# Patient Record
Sex: Female | Born: 1970
Health system: Southern US, Community
[De-identification: ages and names within clinical notes are randomized; demographics above are authoritative.]

## PROBLEM LIST (undated history)

## (undated) DIAGNOSIS — Q211 Atrial septal defect: Secondary | ICD-10-CM

## (undated) DIAGNOSIS — Q2112 Patent foramen ovale: Secondary | ICD-10-CM

## (undated) DIAGNOSIS — Q210 Ventricular septal defect: Secondary | ICD-10-CM

## (undated) HISTORY — DX: Patent foramen ovale: Q21.12

## (undated) HISTORY — DX: Atrial septal defect: Q21.1

## (undated) HISTORY — DX: Ventricular septal defect: Q21.0

---

## 1998-08-27 ENCOUNTER — Other Ambulatory Visit: Admission: RE | Admit: 1998-08-27 | Discharge: 1998-08-27 | Payer: Self-pay | Admitting: Family Medicine

## 1999-05-10 ENCOUNTER — Other Ambulatory Visit: Admission: RE | Admit: 1999-05-10 | Discharge: 1999-05-10 | Payer: Self-pay | Admitting: Obstetrics & Gynecology

## 2000-04-10 ENCOUNTER — Other Ambulatory Visit: Admission: RE | Admit: 2000-04-10 | Discharge: 2000-04-10 | Payer: Self-pay | Admitting: *Deleted

## 2001-04-29 ENCOUNTER — Other Ambulatory Visit: Admission: RE | Admit: 2001-04-29 | Discharge: 2001-04-29 | Payer: Self-pay | Admitting: Obstetrics and Gynecology

## 2001-05-29 ENCOUNTER — Encounter: Payer: Self-pay | Admitting: Obstetrics and Gynecology

## 2001-05-29 ENCOUNTER — Encounter: Admission: RE | Admit: 2001-05-29 | Discharge: 2001-05-29 | Payer: Self-pay | Admitting: Obstetrics and Gynecology

## 2001-06-25 ENCOUNTER — Encounter: Admission: RE | Admit: 2001-06-25 | Discharge: 2001-06-25 | Payer: Self-pay | Admitting: Obstetrics and Gynecology

## 2001-06-25 ENCOUNTER — Encounter: Payer: Self-pay | Admitting: Obstetrics and Gynecology

## 2002-05-12 ENCOUNTER — Other Ambulatory Visit: Admission: RE | Admit: 2002-05-12 | Discharge: 2002-05-12 | Payer: Self-pay | Admitting: Obstetrics and Gynecology

## 2002-12-16 ENCOUNTER — Ambulatory Visit (HOSPITAL_COMMUNITY): Admission: RE | Admit: 2002-12-16 | Discharge: 2002-12-16 | Payer: Self-pay | Admitting: Obstetrics and Gynecology

## 2003-02-02 ENCOUNTER — Encounter: Admission: RE | Admit: 2003-02-02 | Discharge: 2003-02-02 | Payer: Self-pay | Admitting: Obstetrics and Gynecology

## 2003-04-14 ENCOUNTER — Inpatient Hospital Stay (HOSPITAL_COMMUNITY): Admission: AD | Admit: 2003-04-14 | Discharge: 2003-04-17 | Payer: Self-pay | Admitting: Obstetrics and Gynecology

## 2003-04-14 ENCOUNTER — Encounter (INDEPENDENT_AMBULATORY_CARE_PROVIDER_SITE_OTHER): Payer: Self-pay | Admitting: *Deleted

## 2003-05-26 ENCOUNTER — Other Ambulatory Visit: Admission: RE | Admit: 2003-05-26 | Discharge: 2003-05-26 | Payer: Self-pay | Admitting: Obstetrics and Gynecology

## 2004-06-08 ENCOUNTER — Other Ambulatory Visit: Admission: RE | Admit: 2004-06-08 | Discharge: 2004-06-08 | Payer: Self-pay | Admitting: Obstetrics and Gynecology

## 2005-04-19 ENCOUNTER — Inpatient Hospital Stay (HOSPITAL_COMMUNITY): Admission: AD | Admit: 2005-04-19 | Discharge: 2005-04-19 | Payer: Self-pay | Admitting: Obstetrics and Gynecology

## 2005-05-12 ENCOUNTER — Other Ambulatory Visit: Admission: RE | Admit: 2005-05-12 | Discharge: 2005-05-12 | Payer: Self-pay | Admitting: Obstetrics and Gynecology

## 2005-08-31 ENCOUNTER — Ambulatory Visit: Payer: Self-pay | Admitting: Cardiology

## 2005-09-07 ENCOUNTER — Encounter: Payer: Self-pay | Admitting: Cardiology

## 2005-09-07 ENCOUNTER — Ambulatory Visit: Payer: Self-pay

## 2005-10-04 ENCOUNTER — Ambulatory Visit: Payer: Self-pay | Admitting: Surgery

## 2005-10-21 ENCOUNTER — Inpatient Hospital Stay (HOSPITAL_COMMUNITY): Admission: AD | Admit: 2005-10-21 | Discharge: 2005-10-21 | Payer: Self-pay | Admitting: Obstetrics and Gynecology

## 2005-11-11 ENCOUNTER — Encounter (INDEPENDENT_AMBULATORY_CARE_PROVIDER_SITE_OTHER): Payer: Self-pay | Admitting: *Deleted

## 2005-11-11 ENCOUNTER — Inpatient Hospital Stay (HOSPITAL_COMMUNITY): Admission: AD | Admit: 2005-11-11 | Discharge: 2005-11-14 | Payer: Self-pay | Admitting: Obstetrics and Gynecology

## 2006-12-12 ENCOUNTER — Ambulatory Visit: Payer: Self-pay | Admitting: Cardiology

## 2008-04-24 ENCOUNTER — Ambulatory Visit: Payer: Self-pay | Admitting: Cardiology

## 2008-04-24 ENCOUNTER — Encounter: Payer: Self-pay | Admitting: Cardiology

## 2008-05-18 ENCOUNTER — Encounter: Payer: Self-pay | Admitting: Cardiology

## 2008-05-18 ENCOUNTER — Ambulatory Visit: Payer: Self-pay

## 2010-03-05 ENCOUNTER — Encounter: Payer: Self-pay | Admitting: Obstetrics and Gynecology

## 2010-06-28 NOTE — Assessment & Plan Note (Signed)
[facility]                            CARDIOLOGY OFFICE NOTE   ADDASYN, MCBREEN                      MRN:          [id number]  DATE:12/12/2006                            DOB:          Oct 24, 1970    SUBJECTIVE:  Ms. Ostenson returns today for further management of the  following issues:  Small peri-membranous VSD.  She is totally  asymptomatic except for some occasional palpitations.  An echocardiogram  on September 07, 2005, showed no significant change.  Specifically she had  normal left ventricular chamber size and function.  Ejection fraction  was 55%-60%.  There was a small peri-membranous ventricular septal  defect.  Her right ventricular chamber size and function were normal.  There was no significant left atrial or right atrial enlargement.  She  does have a patent foramen by history.  There was no pericardial  effusion.  The pulmonary pressures were estimated as normal.  She still  practices SBE prophylaxis.  I have recommended this, along with Dr.  Theron Arista C. Nishan.   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   CURRENT MEDICATIONS:  1. She is on birth control.  2. Zoloft 50 mg daily.  3. Unisom.  4. A multivitamin.   PHYSICAL EXAMINATION:  VITAL SIGNS:  Blood pressure 92/58, pulse 83 and  regular.  Her electrocardiogram shows a normal sinus rhythm, RSR prime  which is unchanged.  Weight is 114 pounds which is stable for her.  HEENT:  Normocephalic and atraumatic.  Pupils equal, round, reactive to  light and accommodation.  Extraocular movements intact.  Sclerae clear.  Facial symmetry is normal.  NECK:  Carotids are full.  There is no jugular venous distention.  Thyroid is not enlarged.  Trachea midline.  LUNGS:  Clear.  HEART:  Reveals a harsh systolic murmur 3/6, along the left and right  sternal border.  There has been no change from before.  There is no  gallop.  PMI is not displaced.  There is no right  ventricular lift.  ABDOMEN:  Soft.  EXTREMITIES:  No edema.  Pulses intact.  NEUROLOGIC:  Intact exam.   IMPRESSION/PLAN:  Ms. Cassidy is doing well.  I have made no changes in  her program.  I will bring her back again in one year.  If she has any  exertional palpitations, sustained tachycardia, pre-syncope, syncope or  any other symptoms, she will let me know.    Thomas C. Daleen Squibb, MD, [facility]  Electronically Signed   TCW/MedQ  DD: 12/12/2006  DT: 12/12/2006  Job #: [id number]

## 2010-06-28 NOTE — Assessment & Plan Note (Signed)
[facility]                            CARDIOLOGY OFFICE NOTE   Cheryl Escobar, VETSCH                      MRN:          [id number]  DATE:04/24/2008                            DOB:          1970/05/24    [name] comes in today for followup of her small perimembranous VSD  and patent foramen ovale.   She now has two children and has had no problems with either delivery.  Oldest child is almost 5 and her youngest child is now 2-1/2.   She still practices SBE prophylaxis.   She is having no palpitations.  She has no shortness of breath, lower  extremity edema.  She works on a regular basis at [employer] and has no  problems with cardio or with isotonic lifting.   She is currently on multivitamin daily.   PHYSICAL EXAMINATION:  VITAL SIGNS:  Her blood pressure today is 100/72  in the left arm, pulse 73 and regular, her weight is 121 pounds.  BMI is  19.6.  HEENT:  Normal.  NECK:  Supple.  Carotid upstrokes are equal bilaterally without bruits.  No JVD.  Thyroid is not enlarged.  Trachea is midline.  CHEST:  Lungs are clear to auscultation and percussion.  HEART:  Thin chest wall, normal S1 and S2.  No right ventricular lift.  She has a loud holosystolic murmur along the left sternal border.  It  has not changed.  S2 splits physiologically, but is somewhat difficult  to hear at times.  ABDOMEN:  Soft.  EXTREMITIES:  No cyanosis, clubbing, or edema.  Pulses are intact.  NEURO:  Intact.   EKG is essentially normal with a slight right axis.   ASSESSMENT/PLAN:  [name] is doing well.  She is totally  asymptomatic and is able to do regular exercise for a lady her age.  She  has done very well with two deliveries.   Spent a couple of years since we have done an echocardiogram.  I have  arranged for her to have this.  If it is stable, I will see her back in  2 years.     Thomas C. Daleen Squibb, MD, [facility]  Electronically Signed   TCW/MedQ  DD:  04/24/2008  DT: 04/25/2008  Job #: [id number]   cc:   Marcelino Duster L. Vincente Poli, M.D.

## 2010-07-01 NOTE — Discharge Summary (Signed)
NAME:  Cheryl Escobar, Cheryl Escobar                         ACCOUNT NO.:  [id number]   MEDICAL RECORD NO.:  [id number]                   PATIENT TYPE:  INP   LOCATION:  9146                                 FACILITY:  [facility]   PHYSICIAN:  Tracie Harrier, M.D.              DATE OF BIRTH:  23-Jan-1971   DATE OF ADMISSION:  04/14/2003  DATE OF DISCHARGE:  04/17/2003                                 DISCHARGE SUMMARY   ADMITTING DIAGNOSES:  1. Intrauterine pregnancy at term.  2. Induction of labor secondary to oligohydramnios.  3. Mother with ventricular septal defect with patent foramen ovale.   DISCHARGE DIAGNOSES:  1. Status post low transverse cesarean section secondary to nonreassuring     fetal heart tones.  2. Viable female infant.   PROCEDURE:  Primary low transverse cesarean section.   REASON FOR ADMISSION:  Please see written H&P.   HOSPITAL COURSE:  The patient was a 40 year old primigravida that presented  to [facility] for induction of labor secondary to  oligohydramnios.  Amniotic fluid index on previous day of admission was 7.1.  The patient was known to have a ventricular septal defect with a patent  foramen ovale.  She had been evaluated by her cardiologist, Dr. Daleen Squibb, and  also by Dr. Enis Gash, cardiologist at [facility] where EKG/echocardiogram was done  and she was preapproved for a vaginal delivery.  On admission vital signs  were stable, fetal heart tones were reactive.  Cervix was noted to be 2-3 cm  dilated, 80% effaced, vertex at a -2 station.  Artificial rupture of  membranes was performed revealing clear fluid.  Pitocin was started per  protocol.  Epidural was administered for the patient's comfort and SBE  prophylaxis was administered.  The patient did progress to 5 cm dilated,  vertex at a 0 station.  Fetal heart tones developed repetitive deep variable  decelerations with slow return to baseline.  There was no change after  change in position, oxygen  administration, and fluid bolus.  Decision was  made to proceed with a primary low transverse cesarean section for  nonreassuring fetal heart tones.  The patient was then taken to the  operating room where epidural was dosed to an adequate surgical level.  A  low transverse incision was made with the delivery of a viable female infant  weighing 7 pounds 1 ounce with Apgars of 8 at one minute and 9 at five  minutes.  Umbilical cord pH was 7.36.  The patient tolerated the procedure  well and was taken to the recovery room in stable condition.  On  postoperative day #1 vital signs were stable; the patient remained afebrile.  Abdomen was soft with good return of bowel function.  Fundus was firm and  nontender.  Abdominal dressing was noted to be clean, dry, and intact.  Labs  revealed hemoglobin of 10.1; platelet count of 200,000; wbc count of 16.9.  On postoperative day #2 the patient remained afebrile; vital signs were  stable.  Abdomen was soft.  Fundus was firm and nontender.  Abdominal  dressing had been removed revealing an incision that was clean, dry, and  intact.  The patient was ambulating well and tolerating a regular diet  without complaints of nausea and vomiting.  On postoperative day #3 vital  signs were stable; she was afebrile.  Abdomen was soft.  Fundus was firm and  nontender.  Incision was clean, dry, and intact.  Staples were removed and  the patient was discharged home.   CONDITION ON DISCHARGE:  Good.   DIET:  Regular as tolerated.   ACTIVITY:  No heavy lifting, no driving x2 weeks, no vaginal entry.   FOLLOW-UP:  The patient is to follow up in the office in 1 weeks for an  incision check.  She is to call for temperature greater than 100 degrees,  persistent nausea and vomiting, heavy vaginal bleeding, and/or redness or  drainage from the incisional site.   DISCHARGE MEDICATIONS:  1. Percocet 5/325 #30 one p.o. q.4-6h. p.r.n. pain.  2. Motrin 600 mg q.6h. p.r.n.   3. Prenatal vitamins one p.o. daily.  4. Colace one p.o. daily p.r.n.     Julio Sicks, N.P.                        Tracie Harrier, M.D.    CC/MEDQ  D:  05/11/2003  T:  05/11/2003  Job:  [id number]

## 2010-07-01 NOTE — Op Note (Signed)
NAME:  Cheryl Escobar, Cheryl Escobar                         ACCOUNT NO.:  [id number]   MEDICAL RECORD NO.:  [id number]                   PATIENT TYPE:  INP   LOCATION:  9146                                 FACILITY:  [facility]   PHYSICIAN:  Michelle L. Vincente Poli, M.D.            DATE OF BIRTH:  01-17-1971   DATE OF PROCEDURE:  04/14/2003  DATE OF DISCHARGE:                                 OPERATIVE REPORT   PREOPERATIVE DIAGNOSES:  1. Intrauterine pregnancy at term.  2. Oligohydramnios.  3. Maternal ventricular septal defect, patent foramen ovale, __________     reactive fetal heart rate pattern.   POSTOPERATIVE DIAGNOSES:  1. Intrauterine pregnancy at term.  2. Oligohydramnios.  3. Maternal ventricular septal defect, patent foramen ovale, __________     reactive fetal heart rate pattern.   PROCEDURE:  Primary low transverse cesarean section.   SURGEON:  Michelle L. Vincente Poli, M.D.   ANESTHESIA:  Epidural.   FINDINGS:  A female infant, Apgars 8 at one minute and 9 at five minutes  __________.  Tight nuchal cord x1 and a cord pH of 7.36.   DESCRIPTION OF PROCEDURE:  The patient was taken to the operating room,  where her epidural was dosed and found to be adequate.  She was also given  some local at the area where the incision was performed for additional  relief.  After sterile drape was applied, a Foley catheter was noted to  already be in the bladder, which was placed in labor and delivery, and a low  transverse incision was made, carried down to the fascia.  The fascia was  scored in the midline, extended laterally.  A Pfannenstiel incision was used  to separate the rectus muscles.  The rectus muscles were separated in the  midline.  The peritoneum was entered bluntly.  The peritoneal incision was  then stretched.  The bladder blade was inserted.  The lower uterine segment  was identified.  The bladder flap was then created sharply and then  digitally and the bladder blade was then readjusted.  A  low transverse  incision was made in the uterus and the uterus was entered with a hemostat.  The baby was in cephalic presentation.  He was a female infant with Apgars of  8 at one minute and 9 at five minutes.  There was a cord pH of 7.36.  The  baby weighed 7 pounds 1 ounce.  Of note, at the time of delivery there was a  tight nuchal cord x1 noted and a compound presentation where the hand was  just beside where the cord was.  The baby was delivered quite easily.  The  cord was clamped and cut.  The baby was handed to the waiting pediatricians.  The uterus was then manually removed after cord blood was obtained and cord  pH was obtained.  The uterus was exteriorized and cleared of all clots and  debris.  The uterus was normal as well as the adnexa.  The uterine incision  was closed in a single layer using 0 chromic in a continuous running locked  stitch and was hemostatic.  The uterus was returned to the abdomen.  Irrigation was performed.  Hemostasis was again noted.  The peritoneum was  closed using 0 Vicryl using a continuous running stitch, and the  rectus muscles were reapproximated using the same 0 Vicryl.  The fascia was  closed using 0 Vicryl in a continuous running stitch, starting at each  corner and meeting in the midline.  After irrigation of the subcutaneous  layer, the skin was closed with staples.  All sponge, lap, and instrument  counts were correct x2.                                               Michelle L. Vincente Poli, M.D.    Florestine Avers  D:  04/14/2003  T:  04/15/2003  Job:  [id number]

## 2010-07-01 NOTE — Assessment & Plan Note (Signed)
[facility]                              CARDIOLOGY OFFICE NOTE   WENDOLYN, RASO                      MRN:          [id number]  DATE:08/31/2005                            DOB:          19-Nov-1970    HISTORY OF PRESENT ILLNESS:  Mrs. Whitacre returns today.  Her problems list  is:  1.  Small perimembranous VSD.  Echo was stable December 02, 2003.  2.  Patent foramen ovale.   She is pregnancy once again and is due October 27.  There is concern about  some bowel obstruction, though she tested negative for cystic fibrosis.   We have recommended in the past cesarean section because of the risks,  paradoxical emboli.   She questions today about antibiotics, and indeed the guidelines are much  less aggressive than in the past.  However, Dr. Eden Emms and I discussed it  today, and we still think antibiotics are indicated for dental work or for C-  section.   PHYSICAL EXAMINATION:  VITAL SIGNS:  Blood pressure 114/62, pulse 95 and  regular, weight 133.  SKIN:  Warm and dry.  NECK:  Carotids are full.  There is no JVD.  Thyroid is not enlarged.  LUNGS:  Clear.  HEART:  Loud systolic murmur along the left and right sternal border.  There  has been no significant change per my auscultation.  There is no gallop.  PMI is nondisplaced.  There is no right ventricular lift.  ABDOMEN:  Soft.  EXTREMITIES:  No edema.  Pulses are present.   RECOMMENDATIONS:  1.  Surveillance 2D echo.  2.  Antibiotic prophylaxis for dental or cesarean section.  I will forward      this information to Dr. Vincente Poli at [facility].                               Thomas C. Daleen Squibb, MD, [facility]    TCW/MedQ  DD:  08/31/2005  DT:  08/31/2005  Job #:  [id number]   cc:   Marcelino Duster L. Vincente Poli, MD

## 2010-07-01 NOTE — Op Note (Signed)
NAME:  Cheryl Escobar, Cheryl Escobar                         ACCOUNT NO.:  [id number]   MEDICAL RECORD NO.:  [id number]                   PATIENT TYPE:  INP   LOCATION:  9146                                 [facility]ACILITY[facility]   PHYSICIAN:  Michelle L. Vincente Poli, M.D.            DATE OF BIRTH:  09-Aug-1970   DATE OF PROCEDURE:  DATE OF DISCHARGE:  04/17/2003                                 OPERATIVE REPORT   No dictation for this job number.                                               Michelle L. Vincente Poli, M.D.    Florestine Avers  D:  05/19/2003  T:  05/20/2003  Job:  [id number]

## 2010-07-01 NOTE — Discharge Summary (Signed)
NAMERAGUEL, KOSLOSKI NO.:  [id number]   MEDICAL RECORD NO.:  [id number]          PATIENT TYPE:  INP   LOCATION:  9319                          [facility]ACILITY[facility]  [facility]   PHYSICIAN:  Zelphia Cairo, MD    DATE OF BIRTH:  Oct 22, 1970   DATE OF ADMISSION:  11/11/2005  DATE OF DISCHARGE:  11/14/2005                                 DISCHARGE SUMMARY   ADMITTING DIAGNOSIS:  1. Intrauterine pregnancy at term.  2. Previous cesarean section.  3. Spontaneous onset of labor.   DISCHARGE DIAGNOSES:  1. Status post low transverse cesarean section.  2. Viable female infant.   PROCEDURE:  Repeat low transverse cesarean section.   REASON FOR ADMISSION:  Please see written H&P.   HOSPITAL COURSE:  The patient was a 40 year old gravida 3, para 2 that  presented to [facility] at term with spontaneous onset of  labor.  The patient has had a previous cesarean section and had desired  repeat.  The patient then presented to [facility] with  spontaneous onset of labor.  Due to previous cesarean section, decision was  made to proceed with a repeat low transverse cesarean section.  The patient  was then transferred to the operating room where spinal anesthesia was  administered without difficulty.  A low transverse incision was made with  delivery of a viable female infant weighing 6 pounds 2.4 ounces, Apgars of 8  at one and 9 at five minutes.  Baby had been observed during the pregnancy  with an obstruction in the bowel.  The baby was later transferred to [facility].  The patient tolerated the procedure well and was taken to the  recovery room in stable condition.  On postoperative day #1 the patient was  without complaint, vital signs are stable, she is afebrile, abdomen soft  with good return of bowel function, fundus was firm and nontender, abdominal  dressing was noted to be clean, dry and intact and laboratory findings  revealed hemoglobin of  9.7.  On postoperative day #2 baby had undergone  surgery on prior day which was stable at [facility].  The patient's  vital signs were stable, she was afebrile, fundus firm and nontender,  abdominal dressing had been removed revealing an incision that was clean,  dry and intact.  On postoperative day #3 the patient was without complaint,  vital signs were stable, she was afebrile, fundus firm and nontender,  incision was clean, dry and intact, staples removed and the patient was  later discharged home.   CONDITION ON DISCHARGE:  Good.   DIET:  Regular as tolerated.   ACTIVITY:  No heavy lifting, no driving x2 weeks, no vaginal entry.   FOLLOW UP:  Patient to follow up in the office in 1-2 weeks for an incision  check.  She is to call for temperature greater than 100 degrees, persistent  nausea, vomiting, heavy vaginal bleeding and/or redness or drainage from the  incisional site.   DISCHARGE MEDICATIONS:  1. Tylox (#30) one p.o. every 4-6 hours p.r.n.  2. Motrin 600 mg every  6 hours.  3. Prenatal vitamins one p.o. daily.  4. Colace one p.o. daily p.r.n.      Julio Sicks, N.P.      Zelphia Cairo, MD  Electronically Signed    CC/MEDQ  D:  12/01/2005  T:  12/03/2005  Job:  [id number]

## 2010-07-01 NOTE — Op Note (Signed)
NAME:  Cheryl Escobar, DARIS NO.:  [id number]   MEDICAL RECORD NO.:  [id number]          PATIENT TYPE:  INP   LOCATION:  9319                          [facility]  [facility]   PHYSICIAN:  Duke Salvia. Marcelle Overlie, M.D.DATE OF BIRTH:  1971/02/01   DATE OF PROCEDURE:  11/11/2005  DATE OF DISCHARGE:                                 OPERATIVE REPORT   PREOP DIAGNOSIS:  1. A 37-week IUP for scheduled repeat cesarean section.  2. Labor.   POSTOP DIAGNOSIS:  1. A 37-week IUP for scheduled repeat cesarean section.  2. Labor.   PROCEDURE:  Repeat low transverse cesarean section.   SURGEON:  Duke Salvia. Marcelle Overlie, M.D.   ASSISTANT:  None.   ANESTHESIA:  Spinal.   COMPLICATIONS:  None.   DRAINS:  Foley catheter.   BLOOD LOSS:  800 mL   PROCEDURE AND FINDINGS:  The patient was taken to the operating room after  an adequate level of spinal anesthetic was obtained.  The patient was in the  left tilt position; and the abdomen prepped and draped in a sterile manner  for the sterile abdominal procedures.  Foley catheter positioned draining  clear urine.  The old scar was excised and the ellipse carried down to the  fascia which was incised and extended transversely.  Rectus muscle was  divided in the midline.  Peritoneum entered superiorly without incident, and  extended in a vertical fashion.   The vesicouterine serosa was then incised and the bladder was bluntly and  sharply dissected off of the lower uterine segment.  Bladder blade was  positioned.  Transverse incision made in the lower segment, extended with  bandage scissors.  Clear fluid was then noted.  The patient then delivered  of a female, Apgars 8 and 9 in the straight OP presentation.  The infant was  suctioned, cord clamped, and passed to the pediatric team for further care.  Placenta was delivered spontaneously intact; was sent to pathology.  Uterus  exteriorized, cavity wiped clean with laparotomy pack, closure was  obtained  in the first layer of #0 chromic in a locked fashion followed by an  imbricating layer of #0 chromic.  The tubes and ovaries were normal.  Bladder flap area was intact and was noted to be hemostatic. Prior to  closure the sponge and instrument counts were reported correct x2.  Peritoneum closed with a running 2-0 Vicryl  suture.  Fascia closed from laterally to midline on either side with a #0  PDS suture.  Subcutaneous fat was hemostatic.  Clips and Steri-Strips used  on the skin.  Clear urine noted at the end of the case.  He did receive  preop SBE prophylaxis in the form ampicillin and gentamicin.  Mother and  baby doing well at that point.      Richard M. Marcelle Overlie, M.D.  Electronically Signed     [facility]/MEDQ  D:  11/11/2005  T:  11/13/2005  Job:  [id number]

## 2011-02-01 ENCOUNTER — Ambulatory Visit: Payer: Self-pay | Admitting: Cardiology

## 2011-02-03 ENCOUNTER — Encounter: Payer: Self-pay | Admitting: *Deleted

## 2011-02-03 ENCOUNTER — Ambulatory Visit: Payer: Self-pay | Admitting: Cardiology

## 2011-02-03 ENCOUNTER — Encounter: Payer: Self-pay | Admitting: Cardiology

## 2011-02-03 ENCOUNTER — Ambulatory Visit (INDEPENDENT_AMBULATORY_CARE_PROVIDER_SITE_OTHER): Payer: Managed Care, Other (non HMO) | Admitting: Cardiology

## 2011-02-03 VITALS — BP 118/72 | HR 72 | Ht 66.0 in | Wt 121.0 lb

## 2011-02-03 DIAGNOSIS — Q210 Ventricular septal defect: Secondary | ICD-10-CM

## 2011-02-03 NOTE — Progress Notes (Signed)
HPI  Cheryl Escobar comes in today for followup of her small perimembranous VSD. She was last evaluated in 2000 and at which time her echocardiogram was normal except for the small VSD.  She's asymptomatic. He denies palpitations which he once had. She denies any chest discomfort, shortness of breath, or edema. Past Medical History  Diagnosis Date  . VSD (ventricular septal defect)     small peri-membranous  . Patent foramen ovale     Current Outpatient Prescriptions  Medication Sig Dispense Refill  . multivitamin (THERAGRAN) per tablet Take 1 tablet by mouth daily.        Marland Kitchen zolpidem (AMBIEN) 5 MG tablet Take 5 mg by mouth at bedtime as needed.          No Known Allergies  No family history on file.  History   Social History  . Marital Status: Married    Spouse Name: N/A    Number of Children: N/A  . Years of Education: N/A   Occupational History  . Not on file.   Social History Main Topics  . Smoking status: Never Smoker   . Smokeless tobacco: Not on file  . Alcohol Use: No  . Drug Use: No  . Sexually Active: Not on file   Other Topics Concern  . Not on file   Social History Narrative  . No narrative on file    ROS ALL NEGATIVE EXCEPT THOSE NOTED IN HPI  PE  General Appearance: well developed, well nourished in no acute distress HEENT: symmetrical face, PERRLA, good dentition  Neck: no JVD, thyromegaly, or adenopathy, trachea midline Chest: symmetric without deformity Cardiac: PMI non-displaced, RRR, normal S1, S2, no gallop, 3/6 holosystolic murmur along the sternal border. No Right ventricular lift Lung: clear to ausculation and percussion Vascular: all pulses full without bruits  Abdominal: nondistended, nontender, good bowel sounds, no HSM, no bruits Extremities: no cyanosis, clubbing or edema, no sign of DVT, no varicosities  Skin: normal color, no rashes Neuro: alert and oriented x 3, non-focal Pysch: normal affect  EKG Normal sinus rhythm,  rightward axis, incomplete right bundle branch block BMET No results found for this basename: na, k, cl, co2, glucose, bun, creatinine, calcium, gfrnonaa, gfraa    Lipid Panel  No results found for this basename: chol, trig, hdl, cholhdl, vldl, ldlcalc    CBC No results found for this basename: wbc, rbc, hgb, hct, plt, mcv, mch, mchc, rdw, neutrabs, lymphsabs, monoabs, eosabs, basosabs

## 2011-02-03 NOTE — Patient Instructions (Signed)
Your physician recommends that you schedule a follow-up appointment in: 2 years  

## 2011-02-03 NOTE — Assessment & Plan Note (Signed)
Stable by clinical history and exam. Discussed findings of  the previous echo. If she develops any shortness of breath, dyspnea on exertion, or edema I will see her at that time. Otherwise we will see her back in 2 years.

## 2011-08-24 ENCOUNTER — Other Ambulatory Visit: Payer: Self-pay | Admitting: Obstetrics and Gynecology

## 2012-09-17 ENCOUNTER — Other Ambulatory Visit: Payer: Self-pay | Admitting: Obstetrics and Gynecology

## 2012-09-23 ENCOUNTER — Other Ambulatory Visit: Payer: Self-pay | Admitting: Obstetrics and Gynecology

## 2012-09-23 DIAGNOSIS — R928 Other abnormal and inconclusive findings on diagnostic imaging of breast: Secondary | ICD-10-CM

## 2012-10-09 ENCOUNTER — Ambulatory Visit
Admission: RE | Admit: 2012-10-09 | Discharge: 2012-10-09 | Disposition: A | Discharge status: Home / Self Care | Payer: Managed Care, Other (non HMO) | Source: Ambulatory Visit | Attending: Obstetrics and Gynecology | Admitting: Obstetrics and Gynecology

## 2012-10-09 DIAGNOSIS — R928 Other abnormal and inconclusive findings on diagnostic imaging of breast: Secondary | ICD-10-CM

## 2013-01-27 ENCOUNTER — Ambulatory Visit (INDEPENDENT_AMBULATORY_CARE_PROVIDER_SITE_OTHER): Payer: Managed Care, Other (non HMO) | Admitting: Internal Medicine

## 2013-01-27 ENCOUNTER — Encounter: Payer: Self-pay | Admitting: Internal Medicine

## 2013-01-27 VITALS — BP 110/58 | HR 76 | Ht 66.0 in | Wt 121.8 lb

## 2013-01-27 DIAGNOSIS — Z0000 Encounter for general adult medical examination without abnormal findings: Secondary | ICD-10-CM

## 2013-01-27 DIAGNOSIS — Q210 Ventricular septal defect: Secondary | ICD-10-CM

## 2013-01-27 NOTE — Progress Notes (Signed)
HPI  Cheryl Escobar comes in today for followup of her small perimembranous VSD. She was last evaluated in 2000 and at which time her echocardiogram was normal except for the small VSD. Patient was previously followed by T Wall  Seen in 2012 Since seen she has done well  No Cp  Breathing is good  No palpitations.  Active  Past Medical History  Diagnosis Date  . VSD (ventricular septal defect)     small peri-membranous  . Patent foramen ovale     Current Outpatient Prescriptions  Medication Sig Dispense Refill  . multivitamin (THERAGRAN) per tablet Take 1 tablet by mouth daily.        Marland Kitchen zolpidem (AMBIEN) 5 MG tablet Take 5 mg by mouth at bedtime as needed.         No current facility-administered medications for this visit.    No Known Allergies  No family history on file.  History   Social History  . Marital Status: Married    Spouse Name: N/A    Number of Children: N/A  . Years of Education: N/A   Occupational History  . Not on file.   Social History Main Topics  . Smoking status: Never Smoker   . Smokeless tobacco: Not on file  . Alcohol Use: No  . Drug Use: No  . Sexual Activity: Not on file   Other Topics Concern  . Not on file   Social History Narrative  . No narrative on file    ROS ALL NEGATIVE EXCEPT THOSE NOTED IN HPI  PE  General Appearance: well developed, well nourished in no acute distress HEENT: symmetrical face, PERRLA, good dentition  Neck: no JVD, thyromegaly, or adenopathy, trachea midline Chest: symmetric without deformity Cardiac: PMI non-displaced, RRR, normal S1, S2, no gallop, 3/6 holosystolic murmur along the sternal border. No Right ventricular lift  No diastolic murmurs   Lung: clear to ausculation and percussion Vascular: all pulses full without bruits  Abdominal: nondistended, nontender, good bowel sounds, no HSM, no bruits Extremities: no cyanosis, clubbing or edema, no sign of DVT, no varicosities  Skin: normal color, no  rashes Neuro: alert and oriented x 3, non-focal Pysch: normal affect  EKG Normal sinus rhythm  76 bpm  rightward axis, incomplete right bundle branch block    Impression  VSD/PFO  No evid of AI on exam  No RV heave  Asymptomatic  I will review TTE and TEE done in past Other than no scuba diving or sclerotherapy would not place on any restrictions  2.  HCM  Will get labs from Dr Lynnell Dike office  Flu shot today  F/U in 2 Years

## 2013-01-27 NOTE — Patient Instructions (Signed)
Your physician wants you to follow-up in: 2 years  You will receive a reminder letter in the mail two months in advance. If you don't receive a letter, please call our office to schedule the follow-up appointment.  Your physician recommends that you continue on your current medications as directed. Please refer to the Current Medication list given to you today.

## 2013-03-04 ENCOUNTER — Ambulatory Visit (INDEPENDENT_AMBULATORY_CARE_PROVIDER_SITE_OTHER): Payer: Commercial Indemnity | Admitting: Physician Assistant

## 2013-03-04 VITALS — BP 110/60 | HR 70 | Temp 98.1°F | Resp 16 | Ht 67.25 in | Wt 122.0 lb

## 2013-03-04 DIAGNOSIS — K0889 Other specified disorders of teeth and supporting structures: Secondary | ICD-10-CM

## 2013-03-04 DIAGNOSIS — K089 Disorder of teeth and supporting structures, unspecified: Secondary | ICD-10-CM

## 2013-03-04 DIAGNOSIS — J019 Acute sinusitis, unspecified: Secondary | ICD-10-CM

## 2013-03-04 MED ORDER — AMOXICILLIN-POT CLAVULANATE 875-125 MG PO TABS: 1.0000 | ORAL_TABLET | Freq: Two times a day (BID) | ORAL | Status: DC

## 2013-03-04 MED ORDER — IPRATROPIUM BROMIDE 0.06 % NA SOLN: 2.0000 | Freq: Three times a day (TID) | NASAL | Status: DC

## 2013-03-04 NOTE — Progress Notes (Signed)
Subjective:    Patient ID: Lincoln Brigham, female    DOB: 04/30/1970, 43 y.o.   MRN: [id number]  HPI 43 y.o. female presents with 10 day history of nasal congestion, post nasal drip, sore throat, sinus pressure, and dental pain. Afebrile. No chills. Nasal congestion thick and yellow. Sinus pressure/tooth pain is the worst symptom. No cough, SOB, or wheezing. Ears feel full, leading to sensation of muffled hearing. Has tried OTC cold preps without success. No GI complaints. Appetite decreased. No recent antibiotics, recent travels, or sick contacts. No leg trauma, sedentary periods, h/o cancer, or tobacco use. She typically does not get sinus infections.    PMH: Past Medical History  Diagnosis Date  . VSD (ventricular septal defect)     small peri-membranous  . Patent foramen ovale     Home Meds: Prior to Admission medications   Medication Sig Start Date End Date Taking? Authorizing Provider  multivitamin [facility]) per tablet Take 1 tablet by mouth daily.     Yes Historical Provider, MD  temazepam (RESTORIL) 30 MG capsule Take 30 mg by mouth at bedtime as needed for sleep.   Yes Historical Provider, MD           Allergies: No Known Allergies  History   Social History  . Marital Status: Married    Spouse Name: N/A    Number of Children: N/A  . Years of Education: N/A   Occupational History  . Not on file.   Social History Main Topics  . Smoking status: Never Smoker   . Smokeless tobacco: Not on file  . Alcohol Use: No  . Drug Use: No  . Sexual Activity: Not on file   Other Topics Concern  . Not on file   Social History Narrative  . No narrative on file      Review of Systems  Constitutional: Positive for appetite change and fatigue. Negative for fever and chills.  HENT: Positive for congestion, ear pain, hearing loss, postnasal drip, rhinorrhea, sinus pressure, sneezing and sore throat.        Nasal congestion-yellow  Respiratory: Negative for cough,  shortness of breath and wheezing.   Gastrointestinal: Positive for nausea. Negative for vomiting and diarrhea.  Neurological: Positive for headaches.       Objective:   Physical Exam  Physical Exam: Blood pressure 110/60, pulse 70, temperature 98.1 F (36.7 C), temperature source Oral, resp. rate 16, height 5' 7.25" (1.708 m), weight 122 lb (55.339 kg), last menstrual period 03/04/2013, SpO2 100.00%., Body mass index is 18.97 kg/(m^2). General: Well developed, well nourished, in no acute distress. Head: Normocephalic, atraumatic, eyes without discharge, sclera non-icteric, nares are congested. Bilateral auditory canals clear, TM's are without perforation, pearly grey with reflective cone of light bilaterally. Serous effusion bilaterally behind TM's. Palpation of the maxillary sinus relieves pressure. Palpation of the posterior portion of the right and left upper teeth as well as posterior right and left lower teeth creates discomfort. This discomfort is improved as I palpate anteriorly. No signs of cellulitis or abscess. Oral cavity moist, dentition normal. Posterior pharynx with post nasal drip and mild erythema. No peritonsillar abscess or tonsillar exudate. Uvula midline.  Neck: Supple. No thyromegaly. Full ROM. No lymphadenopathy. Lungs: Clear bilaterally to auscultation without wheezes, rales, or rhonchi. Breathing is unlabored.  Heart: RRR with S1 S2. III/VI holosystolic murmur. No rubs, or gallops appreciated. Msk:  Strength and tone normal for age. Extremities: No clubbing or cyanosis. No edema. Neuro: Alert and  oriented X 3. Moves all extremities spontaneously. CNII-XII grossly in tact. Psych:  Responds to questions appropriately with a normal affect.        Assessment & Plan:  43 year old female with sinusitis secondary to URI -Augmentin 875/125 mg 1 po bid #20 no RF -Atrovent NS 0.06% 2 sprays each nare bid prn #1 no RF -Mucinex -Declines imaging at this time -If symptoms  persist recommend imaging to further evaluate  -RTC precautions   Eula Listenyan Sareen Randon, MHS, PA-C [facility] [address] [place], [place] [address] [phone] [facility] 03/04/2013 9:34 PM

## 2013-03-04 NOTE — Patient Instructions (Signed)

## 2013-10-08 ENCOUNTER — Other Ambulatory Visit: Payer: Self-pay

## 2013-10-08 DIAGNOSIS — Z1231 Encounter for screening mammogram for malignant neoplasm of breast: Secondary | ICD-10-CM

## 2013-10-15 ENCOUNTER — Other Ambulatory Visit: Payer: Self-pay | Admitting: Obstetrics and Gynecology

## 2013-10-16 ENCOUNTER — Ambulatory Visit
Admission: RE | Admit: 2013-10-16 | Discharge: 2013-10-16 | Disposition: A | Discharge status: Home / Self Care | Payer: Managed Care, Other (non HMO) | Source: Ambulatory Visit

## 2013-10-16 DIAGNOSIS — Z1231 Encounter for screening mammogram for malignant neoplasm of breast: Secondary | ICD-10-CM

## 2013-10-17 LAB — CYTOLOGY - PAP

## 2014-10-01 ENCOUNTER — Other Ambulatory Visit: Payer: Self-pay

## 2014-10-01 DIAGNOSIS — Z1231 Encounter for screening mammogram for malignant neoplasm of breast: Secondary | ICD-10-CM

## 2014-10-21 ENCOUNTER — Ambulatory Visit
Admission: RE | Admit: 2014-10-21 | Discharge: 2014-10-21 | Disposition: A | Discharge status: Home / Self Care | Payer: Managed Care, Other (non HMO) | Source: Ambulatory Visit

## 2014-10-21 DIAGNOSIS — Z1231 Encounter for screening mammogram for malignant neoplasm of breast: Secondary | ICD-10-CM

## 2015-01-18 ENCOUNTER — Encounter: Payer: Self-pay | Admitting: Internal Medicine

## 2015-01-18 ENCOUNTER — Ambulatory Visit (INDEPENDENT_AMBULATORY_CARE_PROVIDER_SITE_OTHER): Payer: Managed Care, Other (non HMO) | Admitting: Internal Medicine

## 2015-01-18 VITALS — BP 100/60 | HR 72 | Ht 66.0 in | Wt 120.8 lb

## 2015-01-18 DIAGNOSIS — Z23 Encounter for immunization: Secondary | ICD-10-CM | POA: Diagnosis not present

## 2015-01-18 DIAGNOSIS — Z0000 Encounter for general adult medical examination without abnormal findings: Secondary | ICD-10-CM

## 2015-01-18 DIAGNOSIS — Q210 Ventricular septal defect: Secondary | ICD-10-CM

## 2015-01-18 NOTE — Patient Instructions (Addendum)
Your physician recommends that you continue on your current medications as directed. Please refer to the Current Medication list given to you today. Your physician has requested that you have an echocardiogram. Echocardiography is a painless test that uses sound waves to create images of your heart. It provides your doctor with information about the size and shape of your heart and how well your heart's chambers and valves are working. This procedure takes approximately one hour. There are no restrictions for this procedure.  Your physician recommends that you return for lab work WHEN YOU RETURN FOR YOUR ECHOCARDIOGRAM.  Your physician wants you to follow-up in: 2 YEARS WITH DR. Tenny CrawOSS.  You will receive a reminder letter in the mail two months in advance. If you don't receive a letter, please call our office to schedule the follow-up appointment.

## 2015-01-18 NOTE — Progress Notes (Signed)
   Cardiology Office Note   Date:  01/18/2015   ID:  Cheryl Escobar, DOB [birth year], MRN [id number]  PCP:  No primary care provider on file.  Cardiologist:   Dietrich PatesPaula Srihith Aquilino, MD   F/U of VSD      History of Present Illness: Cheryl Escobar is a 44 y.o. female with a history of Small perimemebranous VSD  I saw her in 2014.   Since seen  She has done well  No CP  NO SOB  No palpitations       Current Outpatient Prescriptions  Medication Sig Dispense Refill  . Zolpidem Tartrate (AMBIEN PO) Take 15 mg by mouth at bedtime as needed (insomnia).     No current facility-administered medications for this visit.    Allergies:   Review of patient's allergies indicates no known allergies.   Past Medical History  Diagnosis Date  . VSD (ventricular septal defect)     small peri-membranous  . Patent foramen ovale   . VSD (ventricular septal defect)     Past Surgical History  Procedure Laterality Date  . Cesarean section      x 2     Social History:  The patient  reports that she has never smoked. She does not have any smokeless tobacco history on file. She reports that she drinks alcohol. She reports that she does not use illicit drugs.   Family History:  The patient's family history includes Hyperlipidemia in her mother.    ROS:  Please see the history of present illness. All other systems are reviewed and  Negative to the above problem except as noted.    PHYSICAL EXAM: VS:  BP 100/60 mmHg  Pulse 72  Ht 5\' 6"  (1.676 m)  Wt 54.795 kg (120 lb 12.8 oz)  BMI 19.51 kg/m2  GEN: Well nourished, well developed, in no acute distress HEENT: normal Neck: no JVD, carotid bruits, or masses Cardiac: RRR; II/VI systolic murmur  No diastolic murmur   No, rubs, or gallops,no edema  Respiratory:  clear to auscultation bilaterally, normal work of breathing GI: soft, nontender, nondistended, + BS  No hepatomegaly  MS: no deformity Moving all extremities   Skin: warm and dry, no  rash Neuro:  Strength and sensation are intact Psych: euthymic mood, full affect   EKG:  EKG is ordered today.  SR 72  Incomp RBBB   Lipid Panel No results found for: CHOL, TRIG, HDL, CHOLHDL, VLDL, LDLCALC, LDLDIRECT    Wt Readings from Last 3 Encounters:  01/18/15 54.795 kg (120 lb 12.8 oz)  03/04/13 55.339 kg (122 lb)  01/27/13 55.248 kg (121 lb 12.8 oz)      ASSESSMENT AND PLAN:  1  VSD  No murmur to suggest AI  It has been several years since last echo  i Would set her up to get one done so that we have a baseline in EPIC and to confirm no eccentric jet of AI   Otherwise f/u in 2 years    2  HCM  Set up for lipid panel   Flu shot today      Signed, Dietrich PatesPaula Tamel Abel, MD  01/18/2015 8:38 AM    [facility] [address], [place], [place]  [address] Phone: [phone]; Fax: [phone]

## 2015-01-29 ENCOUNTER — Ambulatory Visit (HOSPITAL_COMMUNITY): Payer: Managed Care, Other (non HMO) | Attending: Internal Medicine

## 2015-01-29 ENCOUNTER — Other Ambulatory Visit (INDEPENDENT_AMBULATORY_CARE_PROVIDER_SITE_OTHER): Payer: Managed Care, Other (non HMO) | Admitting: *Deleted

## 2015-01-29 ENCOUNTER — Other Ambulatory Visit: Payer: Self-pay

## 2015-01-29 DIAGNOSIS — Z0000 Encounter for general adult medical examination without abnormal findings: Secondary | ICD-10-CM | POA: Diagnosis not present

## 2015-01-29 DIAGNOSIS — Q210 Ventricular septal defect: Secondary | ICD-10-CM | POA: Diagnosis not present

## 2015-01-29 DIAGNOSIS — I313 Pericardial effusion (noninflammatory): Secondary | ICD-10-CM | POA: Diagnosis not present

## 2015-01-29 LAB — LIPID PANEL
Cholesterol: 170 mg/dL (ref 125–200)
HDL: 63 mg/dL (ref >=46)
LDL Cholesterol: 98 mg/dL (ref <130)
Total CHOL/HDL Ratio: 2.7 Ratio (ref <=5.0)
Triglycerides: 47 mg/dL (ref <150)
VLDL: 9 mg/dL (ref <30)

## 2015-01-29 NOTE — Addendum Note (Signed)
Addended by: Tonita PhoenixBOWDEN, ROBIN K on: 01/29/2015 08:15 AM   Modules accepted: Orders

## 2015-03-19 ENCOUNTER — Other Ambulatory Visit: Payer: Self-pay

## 2015-03-19 ENCOUNTER — Other Ambulatory Visit: Payer: Self-pay | Admitting: Obstetrics and Gynecology

## 2015-03-19 DIAGNOSIS — N63 Unspecified lump in unspecified breast: Secondary | ICD-10-CM

## 2015-03-24 ENCOUNTER — Other Ambulatory Visit: Payer: Self-pay | Admitting: Obstetrics and Gynecology

## 2015-03-24 ENCOUNTER — Ambulatory Visit
Admission: RE | Admit: 2015-03-24 | Discharge: 2015-03-24 | Disposition: A | Discharge status: Home / Self Care | Payer: Managed Care, Other (non HMO) | Source: Ambulatory Visit

## 2015-03-24 DIAGNOSIS — N63 Unspecified lump in unspecified breast: Secondary | ICD-10-CM

## 2015-06-24 ENCOUNTER — Other Ambulatory Visit: Payer: Self-pay

## 2015-06-24 DIAGNOSIS — Z1231 Encounter for screening mammogram for malignant neoplasm of breast: Secondary | ICD-10-CM

## 2015-10-25 ENCOUNTER — Ambulatory Visit
Admission: RE | Admit: 2015-10-25 | Discharge: 2015-10-25 | Disposition: A | Discharge status: Home / Self Care | Payer: Managed Care, Other (non HMO) | Source: Ambulatory Visit

## 2015-10-25 DIAGNOSIS — Z1231 Encounter for screening mammogram for malignant neoplasm of breast: Secondary | ICD-10-CM

## 2016-11-09 ENCOUNTER — Other Ambulatory Visit: Payer: Self-pay | Admitting: Obstetrics and Gynecology

## 2016-11-09 DIAGNOSIS — Z1231 Encounter for screening mammogram for malignant neoplasm of breast: Secondary | ICD-10-CM

## 2016-11-10 ENCOUNTER — Ambulatory Visit
Admission: RE | Admit: 2016-11-10 | Discharge: 2016-11-10 | Disposition: A | Discharge status: Home / Self Care | Payer: Managed Care, Other (non HMO) | Source: Ambulatory Visit | Attending: Obstetrics and Gynecology | Admitting: Obstetrics and Gynecology

## 2016-11-10 DIAGNOSIS — Z1231 Encounter for screening mammogram for malignant neoplasm of breast: Secondary | ICD-10-CM

## 2017-02-15 DIAGNOSIS — Z809 Family history of malignant neoplasm, unspecified: Secondary | ICD-10-CM | POA: Diagnosis not present

## 2017-02-16 ENCOUNTER — Ambulatory Visit (INDEPENDENT_AMBULATORY_CARE_PROVIDER_SITE_OTHER): Payer: 59 | Admitting: Internal Medicine

## 2017-02-16 ENCOUNTER — Encounter: Payer: Self-pay | Admitting: Internal Medicine

## 2017-02-16 VITALS — BP 108/68 | HR 78 | Ht 66.0 in | Wt 125.2 lb

## 2017-02-16 DIAGNOSIS — Q210 Ventricular septal defect: Secondary | ICD-10-CM | POA: Diagnosis not present

## 2017-02-16 NOTE — Patient Instructions (Signed)
Your physician recommends that you continue on your current medications as directed. Please refer to the Current Medication list given to you today.   Your physician wants you to follow-up in: 3 years with Dr. Tenny Crawoss.  You will receive a reminder letter in the mail two months in advance. If you don't receive a letter, please call our office to schedule the follow-up appointment.

## 2017-02-16 NOTE — Progress Notes (Signed)
   Cardiology Office Note   Date:  02/16/2017   ID:  Cheryl Escobar, DOB 1970-06-28, MRN [id number]  PCP:  Marcelle OverlieGrewal, Michelle, MD  Cardiologist:   Dietrich PatesPaula Ross, MD   F/U of VSD      History of Present Illness: Cheryl Escobar is a 47 y.o. female with a history of Small perimemebranous VSD  I saw her in 2016 She had an echo done after visit  Small VSD again noted  No AI LAbs LDL 98  HDL 63    SInce seen she deneis CP  Breathing is OK  No palpitations     Father age 48 had CABG this past year      Current Outpatient Medications  Medication Sig Dispense Refill  . Zolpidem Tartrate (AMBIEN PO) Take 15 mg by mouth at bedtime as needed (insomnia).     No current facility-administered medications for this visit.     Allergies:   Patient has no known allergies.   Past Medical History:  Diagnosis Date  . Patent foramen ovale   . VSD (ventricular septal defect)    small peri-membranous  . VSD (ventricular septal defect)     Past Surgical History:  Procedure Laterality Date  . CESAREAN SECTION     x 2     Social History:  The patient  reports that  has never smoked. she has never used smokeless tobacco. She reports that she drinks alcohol. She reports that she does not use drugs.   Family History:  The patient's family history includes Hyperlipidemia in her mother.    ROS:  Please see the history of present illness. All other systems are reviewed and  Negative to the above problem except as noted.    PHYSICAL EXAM: VS:  BP 108/68   Pulse 78   Ht 5\' 6"  (1.676 m)   Wt 125 lb 3.2 oz (56.8 kg)   SpO2 99%   BMI 20.21 kg/m   GEN: Well nourished, well developed, in no acute distress HEENT: normal Neck: no JVD, carotid bruits, or masses Cardiac: RRR; II/VI systolic murmur LSB  No diastolic murmur   No, rubs, or gallops,no edema  Respiratory:  clear to auscultation bilaterally, normal work of breathing GI: soft, nontender, nondistended, + BS  No hepatomegaly  MS: no  deformity Moving all extremities   Skin: warm and dry, no rash Neuro:  Strength and sensation are intact Psych: euthymic mood, full affect   EKG:  EKG is not ordered today     Lipid Panel    Component Value Date/Time   CHOL 170 01/29/2015 0828   TRIG 47 01/29/2015 0828   HDL 63 01/29/2015 0828   CHOLHDL 2.7 01/29/2015 0828   VLDL 9 01/29/2015 0828   LDLCALC 98 01/29/2015 0828      Wt Readings from Last 3 Encounters:  02/16/17 125 lb 3.2 oz (56.8 kg)  01/18/15 120 lb 12.8 oz (54.8 kg)  03/04/13 122 lb (55.3 kg)      ASSESSMENT AND PLAN:  1  VSD  No murmur to suggest AI   Echo 2 years ago did not show it    Will f/u with pt in 3 years    2  HCM  Stay active       Signed, Dietrich PatesPaula Ross, MD  02/16/2017 4:19 PM    [facility] [address], [place], [place]  [address] Phone: [phone]; Fax: [phone]

## 2017-10-09 ENCOUNTER — Other Ambulatory Visit: Payer: Self-pay | Admitting: Obstetrics and Gynecology

## 2017-10-09 DIAGNOSIS — Z1231 Encounter for screening mammogram for malignant neoplasm of breast: Secondary | ICD-10-CM

## 2017-11-15 ENCOUNTER — Ambulatory Visit
Admission: RE | Admit: 2017-11-15 | Discharge: 2017-11-15 | Disposition: A | Discharge status: Home / Self Care | Payer: 59 | Source: Ambulatory Visit | Attending: Obstetrics and Gynecology | Admitting: Obstetrics and Gynecology

## 2017-11-15 DIAGNOSIS — Z1231 Encounter for screening mammogram for malignant neoplasm of breast: Secondary | ICD-10-CM

## 2017-12-02 DIAGNOSIS — Z23 Encounter for immunization: Secondary | ICD-10-CM | POA: Diagnosis not present

## 2017-12-12 DIAGNOSIS — H60333 Swimmer's ear, bilateral: Secondary | ICD-10-CM | POA: Diagnosis not present

## 2018-01-17 DIAGNOSIS — Z01419 Encounter for gynecological examination (general) (routine) without abnormal findings: Secondary | ICD-10-CM | POA: Diagnosis not present

## 2018-01-17 DIAGNOSIS — Z6820 Body mass index (BMI) 20.0-20.9, adult: Secondary | ICD-10-CM | POA: Diagnosis not present

## 2018-01-30 DIAGNOSIS — H60333 Swimmer's ear, bilateral: Secondary | ICD-10-CM | POA: Diagnosis not present

## 2018-02-21 ENCOUNTER — Ambulatory Visit: Payer: Self-pay | Admitting: Physician Assistant

## 2018-02-21 ENCOUNTER — Encounter: Payer: Self-pay | Admitting: Physician Assistant

## 2018-02-21 VITALS — BP 110/72 | HR 89 | Temp 98.6°F | Wt 126.2 lb

## 2018-02-21 DIAGNOSIS — R6889 Other general symptoms and signs: Secondary | ICD-10-CM

## 2018-02-21 DIAGNOSIS — J069 Acute upper respiratory infection, unspecified: Secondary | ICD-10-CM

## 2018-02-21 LAB — POCT INFLUENZA A/B
INFLUENZA A, POC: NEGATIVE
Influenza B, POC: NEGATIVE

## 2018-02-21 LAB — POCT RAPID STREP A (OFFICE): Rapid Strep A Screen: NEGATIVE

## 2018-02-21 MED ORDER — PSEUDOEPH-BROMPHEN-DM 30-2-10 MG/5ML PO SYRP: 5.0000 mL | ORAL_SOLUTION | Freq: Three times a day (TID) | ORAL | 0 refills | Status: DC | PRN

## 2018-02-21 MED ORDER — IPRATROPIUM BROMIDE 0.03 % NA SOLN: 2.0000 | Freq: Two times a day (BID) | NASAL | 0 refills | Status: DC

## 2018-02-21 NOTE — Progress Notes (Signed)
MRN: [id number] DOB: [birth year]  Subjective:   Cheryl Escobar is a 48 y.o. female presenting for chief complaint of Sore Throat (cough, chills x 2 days) .  Reports 2 day history of sore throat, subjective fever (99.0), chills, postnasal drainage, congestion and dry cough. Has some nausea. Denies sinus pain, difficulty swallowing, pain with swallowing, inability to swallow, wheezing, shortness of breath and chest pain, vomiting, abdominal pain and diarrhea. Has tried OTC cold and flu medication with no full relief No known sick contact exposure. No PMH of allergies, asthma, DM, or HTN.  Patient has had flu shot this season. Denies smoking.  Patient wanted to make sure that she did not have flu before going on a trip this weekend.  Denies any other aggravating or relieving factors, no other questions or concerns.  Review of Systems  Constitutional: Negative for diaphoresis.  Respiratory: Negative for hemoptysis and sputum production.   Cardiovascular: Negative for palpitations.  Genitourinary: Negative for dysuria, flank pain, frequency, hematuria and urgency.  Musculoskeletal: Negative for neck pain.  Skin: Negative for rash.  Neurological: Negative for dizziness and headaches.    Thurston Holenne has a current medication list which includes the following prescription(s): eszopiclone, brompheniramine-pseudoephedrine-dm, ipratropium, and zolpidem tartrate. Also has No Known Allergies.  Thurston Holenne  has a past medical history of Patent foramen ovale, VSD (ventricular septal defect), and VSD (ventricular septal defect). Also  has a past surgical history that includes Cesarean section.   Objective:   Vitals: BP 110/72 (BP Location: Right Arm, Patient Position: Sitting)   Pulse 89   Temp 98.6 F (37 C) (Oral)   Wt 126 lb 3.2 oz (57.2 kg)   SpO2 100%   BMI 20.37 kg/m   Physical Exam Vitals signs reviewed.  Constitutional:      General: She is not in acute distress.    Appearance: She is well-developed.  She is not ill-appearing or toxic-appearing.  HENT:     Head: Normocephalic and atraumatic.     Right Ear: Tympanic membrane, ear canal and external ear normal.     Left Ear: Tympanic membrane, ear canal and external ear normal.     Nose: Mucosal edema and congestion present.     Right Sinus: No maxillary sinus tenderness or frontal sinus tenderness.     Left Sinus: No maxillary sinus tenderness or frontal sinus tenderness.     Mouth/Throat:     Lips: Pink.     Mouth: Mucous membranes are moist.     Pharynx: Uvula midline. Posterior oropharyngeal erythema present.     Tonsils: No tonsillar exudate or tonsillar abscesses. Swelling: 1+ on the right. 1+ on the left.     Comments: Tonsils are erythematous b/l Eyes:     Conjunctiva/sclera: Conjunctivae normal.  Neck:     Musculoskeletal: Normal range of motion.  Cardiovascular:     Rate and Rhythm: Normal rate and regular rhythm.     Heart sounds: Murmur (systolic) present.  Pulmonary:     Effort: Pulmonary effort is normal.     Breath sounds: Normal breath sounds. No decreased breath sounds, wheezing, rhonchi or rales.  Lymphadenopathy:     Head:     Right side of head: No submental, submandibular, tonsillar, preauricular, posterior auricular or occipital adenopathy.     Left side of head: No submental, submandibular, tonsillar, preauricular, posterior auricular or occipital adenopathy.     Cervical: No cervical adenopathy.     Upper Body:     Right upper body: No supraclavicular  adenopathy.     Left upper body: No supraclavicular adenopathy.  Skin:    General: Skin is warm and dry.  Neurological:     Mental Status: She is alert.     Results for orders placed or performed in visit on 02/21/18 (from the past 24 hour(s))  POCT Influenza A/B     Status: Normal   Collection Time: 02/21/18  1:38 PM  Result Value Ref Range   Influenza A, POC Negative Negative   Influenza B, POC Negative Negative  POCT rapid strep A     Status:  Normal   Collection Time: 02/21/18  2:06 PM  Result Value Ref Range   Rapid Strep A Screen Negative Negative    Assessment and Plan :  1. Viral URI Patient is overall well-appearing, no acute distress.  Vital signs stable.  Rapid flu and strep test negative, patient reassured.  Lungs CTAB.  This is consistent with viral URI.  No red flags noted on exam.  Recommend symptomatic treatment at this time.  Advised to follow-up with family doctor or urgent care if symptoms persist past 7 to 10 days.  Seek care sooner if symptoms worsen/develop new concerning symptoms.  Patient voices understanding.  2. Flu-like symptoms - POCT Influenza A/B - POCT rapid strep A  Meds ordered this encounter  Medications  . brompheniramine-pseudoephedrine-DM 30-2-10 MG/5ML syrup    Sig: Take 5 mLs by mouth 3 (three) times daily as needed.    Dispense:  120 mL    Refill:  0    Order Specific Question:   Supervising Provider    Answer:   MILLER, BRIAN [[id number]]  . ipratropium (ATROVENT) 0.03 % nasal spray    Sig: Place 2 sprays into both nostrils 2 (two) times daily.    Dispense:  30 mL    Refill:  0    Order Specific Question:   Supervising Provider    Answer:   Hyacinth Meeker, BRIAN [[id number]]    Benjiman Core, PA-C  [facility] 02/21/2018 2:07 PM

## 2018-02-21 NOTE — Patient Instructions (Signed)
Upper Respiratory Infection, Adult  -Your strep and flu tests were negative. This is like a viral upper respiratory illness. I recommend rest, oral hydration, and using medication for symptomatic relief.  - You may take ibuprofen 600-800mg  with food for your throat every 8 hours. Tylenol can also be used for sore throat. - You may also use 2 tablespoons of warm honey every 4-6 hours to coat and soothe your throat. I prefer drinking a tea with honey. You can purchase ginger tumeric tea at [employer] and add honey. - Drink plenty of water, at least 64 ounces daily and rest to make sure your body has a chance to get better. -Use Rx cough syrup for cough and congestion. -Use atrovent nasal spray for congestion.  -Follow up with family doctor or urgent care if no improvement in 7-10 days. Seek care sooner if your symptoms worsen or you develop new concerning symptoms.    An upper respiratory infection (URI) affects the nose, throat, and upper air passages. URIs are caused by germs (viruses). The most common type of URI is often called "the common cold." Medicines cannot cure URIs, but you can do things at home to relieve your symptoms. URIs usually get better within 7-10 days. Follow these instructions at home: Activity  Rest as needed.  If you have a fever, stay home from work or school until your fever is gone, or until your doctor says you may return to work or school. ? You should stay home until you cannot spread the infection anymore (you are not contagious). ? Your doctor may have you wear a face mask so you have less risk of spreading the infection. Relieving symptoms  Gargle with a salt-water mixture 3-4 times a day or as needed. To make a salt-water mixture, completely dissolve -1 tsp of salt in 1 cup of warm water.  Use a cool-mist humidifier to add moisture to the air. This can help you breathe more easily. Eating and drinking   Drink enough fluid to keep your pee (urine) pale  yellow.  Eat soups and other clear broths. General instructions   Take over-the-counter and prescription medicines only as told by your doctor. These include cold medicines, fever reducers, and cough suppressants.  Do not use any products that contain nicotine or tobacco. These include cigarettes and e-cigarettes. If you need help quitting, ask your doctor.  Avoid being where people are smoking (avoid secondhand smoke).  Make sure you get regular shots and get the flu shot every year.  Keep all follow-up visits as told by your doctor. This is important. How to avoid spreading infection to others   Wash your hands often with soap and water. If you do not have soap and water, use hand sanitizer.  Avoid touching your mouth, face, eyes, or nose.  Cough or sneeze into a tissue or your sleeve or elbow. Do not cough or sneeze into your hand or into the air. Contact a doctor if:  You are getting worse, not better.  You have any of these: ? A fever. ? Chills. ? Brown or red mucus in your nose. ? Yellow or brown fluid (discharge)coming from your nose. ? Pain in your face, especially when you bend forward. ? Swollen neck glands. ? Pain with swallowing. ? White areas in the back of your throat. Get help right away if:  You have shortness of breath that gets worse.  You have very bad or constant: ? Headache. ? Ear pain. ? Pain  in your forehead, behind your eyes, and over your cheekbones (sinus pain). ? Chest pain.  You have long-lasting (chronic) lung disease along with any of these: ? Wheezing. ? Long-lasting cough. ? Coughing up blood. ? A change in your usual mucus.  You have a stiff neck.  You have changes in your: ? Vision. ? Hearing. ? Thinking. ? Mood. Summary  An upper respiratory infection (URI) is caused by a germ called a virus. The most common type of URI is often called "the common cold."  URIs usually get better within 7-10 days.  Take  over-the-counter and prescription medicines only as told by your doctor. This information is not intended to replace advice given to you by your health care provider. Make sure you discuss any questions you have with your health care provider. Document Released: 07/19/2007 Document Revised: 09/22/2016 Document Reviewed: 09/22/2016 Elsevier Interactive Patient Education  2019 [employer]

## 2018-10-08 ENCOUNTER — Other Ambulatory Visit: Payer: Self-pay

## 2018-10-08 DIAGNOSIS — Z20822 Contact with and (suspected) exposure to covid-19: Secondary | ICD-10-CM

## 2018-10-09 LAB — NOVEL CORONAVIRUS, NAA: SARS-CoV-2, NAA: NOT DETECTED

## 2018-11-28 ENCOUNTER — Other Ambulatory Visit: Payer: Self-pay

## 2018-11-28 DIAGNOSIS — Z20822 Contact with and (suspected) exposure to covid-19: Secondary | ICD-10-CM

## 2018-11-30 LAB — NOVEL CORONAVIRUS, NAA: SARS-CoV-2, NAA: NOT DETECTED

## 2019-06-30 ENCOUNTER — Other Ambulatory Visit: Payer: Self-pay | Admitting: Obstetrics and Gynecology

## 2019-06-30 DIAGNOSIS — Z1231 Encounter for screening mammogram for malignant neoplasm of breast: Secondary | ICD-10-CM

## 2019-07-10 ENCOUNTER — Ambulatory Visit
Admission: RE | Admit: 2019-07-10 | Discharge: 2019-07-10 | Disposition: A | Discharge status: Home / Self Care | Payer: 59 | Source: Ambulatory Visit | Attending: Obstetrics and Gynecology | Admitting: Obstetrics and Gynecology

## 2019-07-10 DIAGNOSIS — Z1231 Encounter for screening mammogram for malignant neoplasm of breast: Secondary | ICD-10-CM

## 2019-12-19 ENCOUNTER — Ambulatory Visit (INDEPENDENT_AMBULATORY_CARE_PROVIDER_SITE_OTHER): Payer: 59 | Admitting: Internal Medicine

## 2019-12-19 ENCOUNTER — Encounter: Payer: Self-pay | Admitting: Internal Medicine

## 2019-12-19 ENCOUNTER — Other Ambulatory Visit: Payer: Self-pay

## 2019-12-19 VITALS — BP 90/60 | HR 68 | Ht 66.0 in | Wt 122.4 lb

## 2019-12-19 DIAGNOSIS — Q210 Ventricular septal defect: Secondary | ICD-10-CM | POA: Diagnosis not present

## 2019-12-19 NOTE — Patient Instructions (Signed)
Medication Instructions:  No changes *If you need a refill on your cardiac medications before your next appointment, please call your pharmacy*   Lab Work: none If you have labs (blood work) drawn today and your tests are completely normal, you will receive your results only by: Marland Kitchen MyChart Message (if you have MyChart) OR . A paper copy in the mail If you have any lab test that is abnormal or we need to change your treatment, we will call you to review the results.   Testing/Procedures:  DR. Bethann Goo TO DO ECHO Your physician has requested that you have an echocardiogram. Echocardiography is a painless test that uses sound waves to create images of your heart. It provides your doctor with information about the size and shape of your heart and how well your heart's chambers and valves are working. This procedure takes approximately one hour. There are no restrictions for this procedure.   Follow-Up: At [facility], you and your health needs are our priority.  As part of our continuing mission to provide you with exceptional heart care, we have created designated Provider Care Teams.  These Care Teams include your primary Cardiologist (physician) and Advanced Practice Providers (APPs -  Physician Assistants and Nurse Practitioners) who all work together to provide you with the care you need, when you need it.  Your next appointment:   2 year(s)  The format for your next appointment:   In Person  Provider:   Dietrich Pates, MD   Other Instructions

## 2019-12-19 NOTE — Progress Notes (Signed)
Cardiology Office Note   Date:  12/19/2019   ID:  Cheryl Escobar, DOB May 16, 1970, MRN [id number]  PCP:  Marcelle Overlie, MD  Cardiologist:   Dietrich Pates, MD   F/U of VSD      History of Present Illness: Cheryl Escobar is a 49 y.o. female with a history of Small perimembranous VSD   Since I saw her last she has done well   The pt says she has been feeling good   SHe denies CP  Breathing is OK    Current Outpatient Medications  Medication Sig Dispense Refill  . brompheniramine-pseudoephedrine-DM 30-2-10 MG/5ML syrup Take 5 mLs by mouth 3 (three) times daily as needed. 120 mL 0  . Zolpidem Tartrate (AMBIEN PO) Take 15 mg by mouth at bedtime as needed (insomnia).     No current facility-administered medications for this visit.    Allergies:   Patient has no known allergies.   Past Medical History:  Diagnosis Date  . Patent foramen ovale   . VSD (ventricular septal defect)    small peri-membranous  . VSD (ventricular septal defect)     Past Surgical History:  Procedure Laterality Date  . CESAREAN SECTION     x 2     Social History:  The patient  reports that she has never smoked. She has never used smokeless tobacco. She reports current alcohol use. She reports that she does not use drugs.   Family History:  The patient's family history includes Breast cancer in her maternal aunt; Hyperlipidemia in her mother.    ROS:  Please see the history of present illness. All other systems are reviewed and  Negative to the above problem except as noted.    PHYSICAL EXAM: VS:  BP 90/60   Pulse 68   Ht 5\' 6"  (1.676 m)   Wt 122 lb 6.4 oz (55.5 kg)   SpO2 98%   BMI 19.76 kg/m   GEN: Well nourished, well developed, in no acute distress HEENT: normal Neck: no JVD, carotid bruits, or masses Cardiac: RRR; II/VI systolic murmur LSB  No diastolic murmur   No LE edema  Respiratory:  clear to auscultation bilaterally, normal work of breathing GI: soft, nontender,  nondistended, + BS  No hepatomegaly  MS: no deformity Moving all extremities   Skin: warm and dry, no rash Neuro:  Strength and sensation are intact Psych: euthymic mood, full affect   EKG:  EKG is ordered today    SR 68 bpm     Lipid Panel    Component Value Date/Time   CHOL 170 01/29/2015 0828   TRIG 47 01/29/2015 0828   HDL 63 01/29/2015 0828   CHOLHDL 2.7 01/29/2015 0828   VLDL 9 01/29/2015 0828   LDLCALC 98 01/29/2015 0828      Wt Readings from Last 3 Encounters:  12/19/19 122 lb 6.4 oz (55.5 kg)  02/21/18 126 lb 3.2 oz (57.2 kg)  02/16/17 125 lb 3.2 oz (56.8 kg)      ASSESSMENT AND PLAN:  1  VSD  Will get echo to reevaluate VSD and r/o AI    2  HCM  Lab:  LDL 113   HDL 65   Trig 110  Chol 197    F/U in 2 years       Signed, 04/16/17, MD  12/19/2019 9:39 AM    [facility] [address] [place], [place], [place]  [place] Phone: [phone]; Fax: [phone]

## 2020-01-30 ENCOUNTER — Other Ambulatory Visit: Payer: Self-pay

## 2020-01-30 ENCOUNTER — Ambulatory Visit (HOSPITAL_COMMUNITY): Payer: 59 | Attending: Internal Medicine

## 2020-01-30 DIAGNOSIS — Q210 Ventricular septal defect: Secondary | ICD-10-CM | POA: Insufficient documentation

## 2020-01-30 LAB — ECHOCARDIOGRAM COMPLETE
Area-P 1/2: 3.21 cm2
S' Lateral: 3.15 cm

## 2020-04-09 ENCOUNTER — Encounter: Payer: Self-pay | Admitting: Gastroenterology

## 2020-06-14 ENCOUNTER — Encounter: Payer: 59 | Admitting: Gastroenterology

## 2020-12-01 ENCOUNTER — Other Ambulatory Visit: Payer: Self-pay | Admitting: Obstetrics and Gynecology

## 2020-12-01 DIAGNOSIS — Z1231 Encounter for screening mammogram for malignant neoplasm of breast: Secondary | ICD-10-CM

## 2021-01-03 ENCOUNTER — Ambulatory Visit
Admission: RE | Admit: 2021-01-03 | Discharge: 2021-01-03 | Disposition: A | Discharge status: Home / Self Care | Payer: No Typology Code available for payment source | Source: Ambulatory Visit | Attending: Obstetrics and Gynecology | Admitting: Obstetrics and Gynecology

## 2021-01-03 DIAGNOSIS — Z1231 Encounter for screening mammogram for malignant neoplasm of breast: Secondary | ICD-10-CM

## 2021-02-17 DIAGNOSIS — M25511 Pain in right shoulder: Secondary | ICD-10-CM | POA: Insufficient documentation

## 2021-02-17 DIAGNOSIS — M7531 Calcific tendinitis of right shoulder: Secondary | ICD-10-CM | POA: Insufficient documentation

## 2021-09-13 ENCOUNTER — Ambulatory Visit: Payer: No Typology Code available for payment source | Admitting: Family Medicine

## 2021-09-13 NOTE — Progress Notes (Unsigned)
Cheryl Escobar [address] [place] [address] Phone: [phone] Subjective:   Cheryl Escobar, am serving as a scribe for Dr. Antoine Escobar.  I'm seeing this patient by the request  of:  Cheryl Overlie, MD  CC: Left heel pain  VFI:[id number]  Cheryl Escobar is a 51 y.o. female coming in with complaint of left heel pain for past year. Pain over bottom of heel and surrounding the heel. Patient feels like she has antalgic gait when she goes for a walk. Painful to lie heel on the bed and to put her shoe on and off. Wears CROCs to work since the heel counter hurts the foot. Tries to bike for workouts instead of walking.        Past Medical History:  Diagnosis Date   Patent foramen ovale    VSD (ventricular septal defect)    small peri-membranous   VSD (ventricular septal defect)    Past Surgical History:  Procedure Laterality Date   CESAREAN SECTION     x 2   Social History   Socioeconomic History   Marital status: Married    Spouse name: Not on file   Number of children: Not on file   Years of education: Not on file   Highest education level: Not on file  Occupational History   Not on file  Tobacco Use   Smoking status: Never   Smokeless tobacco: Never  Substance and Sexual Activity   Alcohol use: Yes   Drug use: No   Sexual activity: Not on file  Other Topics Concern   Not on file  Social History Narrative   Not on file   Social Determinants of Health   Financial Resource Strain: Not on file  Food Insecurity: Not on file  Transportation Needs: Not on file  Physical Activity: Not on file  Stress: Not on file  Social Connections: Not on file   No Known Allergies Family History  Problem Relation Age of Onset   Hyperlipidemia Mother    Breast cancer Maternal Aunt        diagnosed in her 51's    Current Outpatient Medications (Endocrine & Metabolic):    predniSONE (DELTASONE) 20 MG tablet, Take 2 tablets (40 mg  total) by mouth daily with breakfast.   Current Outpatient Medications (Respiratory):    brompheniramine-pseudoephedrine-DM 30-2-10 MG/5ML syrup, Take 5 mLs by mouth 3 (three) times daily as needed.    Current Outpatient Medications (Other):    gabapentin (NEURONTIN) 100 MG capsule, Take 2 capsules (200 mg total) by mouth at bedtime.   Zolpidem Tartrate (AMBIEN PO), Take 15 mg by mouth at bedtime as needed (insomnia).   Reviewed prior external information including notes and imaging from  primary care provider As well as notes that were available from care everywhere and other healthcare systems.  Past medical history, social, surgical and family history all reviewed in electronic medical record.  No pertanent information unless stated regarding to the chief complaint.   Review of Systems:  No headache, visual changes, nausea, vomiting, diarrhea, constipation, dizziness, abdominal pain, skin rash, fevers, chills, night sweats, weight loss, swollen lymph nodes, body aches, joint swelling, chest pain, shortness of breath, mood changes. POSITIVE muscle aches  Objective  Blood pressure 116/76, pulse 89, height 5\' 6"  (1.676 m), weight 128 lb (58.1 kg), SpO2 90 %.   General: No apparent distress alert and oriented x3 mood and affect normal, dressed appropriately.  HEENT: Pupils equal, extraocular  movements intact  Respiratory: Patient's speak in full sentences and does not appear short of breath  Cardiovascular: No lower extremity edema, non tender, no erythema  Left hip exam on the inspection is fairly unremarkable.  The patient does have some tenderness to palpation diffusely.  Some of it seems to be more on the medial aspect and on the plantar aspect of the calcaneal area.  Patient has full range of motion of the ankle.  Negative Tinel's sign noted.  Limited muscular skeletal ultrasound was performed and interpreted by Cheryl Escobar, M Limited ultrasound of patient's heel shows very mild  hypoechoic changes of the fat pad.  Patient does have some narrowing of the tarsal tunnel and some hypoechoic changes that seems to be surrounding posterior tibialis nerve. Impression: Irritation of the fat pad and questionable tarsal tunnel syndrome   [id number]; 15 additional minutes spent for Therapeutic exercises as stated in above notes.  This included exercises focusing on stretching, strengthening, with significant focus on eccentric aspects.   Long term goals include an improvement in range of motion, strength, endurance as well as avoiding reinjury. Patient's frequency would include in 1-2 times a day, 3-5 times a week for a duration of 6-12 weeks. Ankle strengthening that included:  Basic range of motion exercises to allow proper full motion at ankle Stretching of the lower leg and hamstrings  Theraband exercises for the lower leg - inversion, eversion, dorsiflexion and plantarflexion each to be completed with a theraband Balance exercises to increase proprioception Weight bearing exercises to increase strength and balance   Proper technique shown and discussed handout in great detail with ATC.  All questions were discussed and answered.     Impression and Recommendations:

## 2021-09-14 ENCOUNTER — Ambulatory Visit (INDEPENDENT_AMBULATORY_CARE_PROVIDER_SITE_OTHER): Payer: No Typology Code available for payment source

## 2021-09-14 ENCOUNTER — Ambulatory Visit: Payer: Self-pay

## 2021-09-14 ENCOUNTER — Ambulatory Visit (INDEPENDENT_AMBULATORY_CARE_PROVIDER_SITE_OTHER): Payer: No Typology Code available for payment source | Admitting: Family Medicine

## 2021-09-14 ENCOUNTER — Encounter: Payer: Self-pay | Admitting: Family Medicine

## 2021-09-14 VITALS — BP 116/76 | HR 89 | Ht 66.0 in | Wt 128.0 lb

## 2021-09-14 DIAGNOSIS — M79672 Pain in left foot: Secondary | ICD-10-CM | POA: Diagnosis not present

## 2021-09-14 DIAGNOSIS — G4700 Insomnia, unspecified: Secondary | ICD-10-CM | POA: Insufficient documentation

## 2021-09-14 MED ORDER — PREDNISONE 20 MG PO TABS: 40.0000 mg | ORAL_TABLET | Freq: Every day | ORAL | 0 refills | Status: DC

## 2021-09-14 MED ORDER — GABAPENTIN 100 MG PO CAPS: 200.0000 mg | ORAL_CAPSULE | Freq: Every day | ORAL | 0 refills | Status: DC

## 2021-09-14 NOTE — Assessment & Plan Note (Signed)
Patient does have heel pain.  This seems to be on the left side.  Patient does have it where it seems to be more in the fat pad.  Discussed with patient that this could be irritation of the fat pad or the possibility of tarsal tunnel syndrome.  Started on prednisone as well as gabapentin.  We discussed other recovery sandals as well.  X-rays are pending.  Discussed heel lifts in shoes can could open up the tarsal tunnel as well.  Follow-up again in 6 to 8 weeks otherwise.

## 2021-09-14 NOTE — Patient Instructions (Signed)
Xray today Exercises 3x a week Prednisone 40 mg for 5 days Gabapentin 200mg  at night Tart cherry extract 1200mg  at night Recovery sandals OOFOS Or HOKA Heel lifts  See me again in 6 weeks for L heel pain

## 2021-10-25 NOTE — Progress Notes (Signed)
[facility] [address] [place] [address] Phone: [phone] Subjective:   Cheryl Escobar, am serving as a scribe for Dr. Antoine Primas.  I'm seeing this patient by the request  of:  Marcelle Overlie, MD  CC: Left heel pain follow-up  [id number]  09/14/2021 Patient does have heel pain.  This seems to be on the left side.  Patient does have it where it seems to be more in the fat pad.  Discussed with patient that this could be irritation of the fat pad or the possibility of tarsal tunnel syndrome.  Started on prednisone as well as gabapentin.  We discussed other recovery sandals as well.  X-rays are pending.  Discussed heel lifts in shoes can could open up the tarsal tunnel as well.  Follow-up again in 6 to 8 weeks otherwise.  Updated 10/26/2021 Cheryl Escobar is a 51 y.o. female coming in with complaint of L heel pain. No change since last visit. Patient uses heel lifts when she wears tennis shoes but she only wears tennis shoes when working out. Recovery sandals are helpful but caused a blister so she limits use of these. Gabapentin stopped cramps in back of her legs. Discontinued tart cherry due to capsule upsetting her stomach. Unable to see a difference with supplement anyway. Prednisone did help when she was on it but pain returned after she finished the course. Having R sided lumbar spine that she feels is resultant from altered gait.        Past Medical History:  Diagnosis Date   Patent foramen ovale    VSD (ventricular septal defect)    small peri-membranous   VSD (ventricular septal defect)    Past Surgical History:  Procedure Laterality Date   CESAREAN SECTION     x 2   Social History   Socioeconomic History   Marital status: Married    Spouse name: Not on file   Number of children: Not on file   Years of education: Not on file   Highest education level: Not on file  Occupational History   Not on file  Tobacco Use    Smoking status: Never   Smokeless tobacco: Never  Substance and Sexual Activity   Alcohol use: Yes   Drug use: No   Sexual activity: Not on file  Other Topics Concern   Not on file  Social History Narrative   Not on file   Social Determinants of Health   Financial Resource Strain: Not on file  Food Insecurity: Not on file  Transportation Needs: Not on file  Physical Activity: Not on file  Stress: Not on file  Social Connections: Not on file   No Known Allergies Family History  Problem Relation Age of Onset   Hyperlipidemia Mother    Breast cancer Maternal Aunt        diagnosed in her 59's    Current Outpatient Medications (Endocrine & Metabolic):    predniSONE (DELTASONE) 20 MG tablet, Take 2 tablets (40 mg total) by mouth daily with breakfast.   Current Outpatient Medications (Respiratory):    brompheniramine-pseudoephedrine-DM 30-2-10 MG/5ML syrup, Take 5 mLs by mouth 3 (three) times daily as needed.    Current Outpatient Medications (Other):    gabapentin (NEURONTIN) 100 MG capsule, Take 2 capsules (200 mg total) by mouth at bedtime.   Zolpidem Tartrate (AMBIEN PO), Take 15 mg by mouth at bedtime as needed (insomnia).    Review of Systems:  No headache,  visual changes, nausea, vomiting, diarrhea, constipation, dizziness, abdominal pain, skin rash, fevers, chills, night sweats, weight loss, swollen lymph nodes, body aches, joint swelling, chest pain, shortness of breath, mood changes.  Objective  Blood pressure 110/72, pulse 75, height 5\' 6"  (1.676 m), weight 127 lb (57.6 kg), SpO2 99 %.   General: No apparent distress alert and oriented x3 mood and affect normal, dressed appropriately.  HEENT: Pupils equal, extraocular movements intact  Respiratory: Patient's speak in full sentences and does not appear short of breath  Cardiovascular: No lower extremity edema, non tender, no erythema  Left foot exam shows the patient does have more tenderness noted over the  medial calcaneal area.  The patient still has very mild breakdown of the longitudinal arch noted.  Very mild overpronation of the hindfoot.  Limited muscular skeletal ultrasound was performed and interpreted by , M  Limited ultrasound shows the patient does have mild hypoechoic changes of the plantar fascia noted.  No significant irregularity noted of the posterior tibialis.  Patient still has a abnormality with some calcific changes noted at the tarsal tunnel. Impression: No interval improvement.   Procedure: Real-time Ultrasound Guided Injection of left plantar fascia Device: GE Logiq Q7 Ultrasound guided injection is preferred based studies that show increased duration, increased effect, greater accuracy, decreased procedural pain, increased response rate, and decreased cost with ultrasound guided versus blind injection.  Verbal informed consent obtained.  Time-out conducted.  Noted no overlying erythema, induration, or other signs of local infection.  Skin prepped in a sterile fashion.  Local anesthesia: Topical Ethyl chloride.  With sterile technique and under real time ultrasound guidance: With a 25-gauge 1 and  half inch needle patient was injected with 0.5 cc of 0.5% Marcaine and 0.5 cc of Kenalog 40 mg/mL Completed without difficulty  Pain immediately resolved suggesting accurate placement of the medication.  Advised to call if fevers/chills, erythema, induration, drainage, or persistent bleeding.  Impression: Technically successful ultrasound guided injection.    Impression and Recommendations:    The above documentation has been reviewed and is accurate and complete Antoine Primas, DO

## 2021-10-26 ENCOUNTER — Ambulatory Visit: Payer: Self-pay

## 2021-10-26 ENCOUNTER — Ambulatory Visit (INDEPENDENT_AMBULATORY_CARE_PROVIDER_SITE_OTHER): Payer: No Typology Code available for payment source | Admitting: Family Medicine

## 2021-10-26 ENCOUNTER — Encounter: Payer: Self-pay | Admitting: Family Medicine

## 2021-10-26 VITALS — BP 110/72 | HR 75 | Ht 66.0 in | Wt 127.0 lb

## 2021-10-26 DIAGNOSIS — M79672 Pain in left foot: Secondary | ICD-10-CM | POA: Diagnosis not present

## 2021-10-26 NOTE — Patient Instructions (Addendum)
Injected plantar fascia today Hopefully that helps Go shoe shopping Avoid barefoot  Send message in 2 week

## 2021-10-27 ENCOUNTER — Encounter: Payer: Self-pay | Admitting: Family Medicine

## 2021-10-27 NOTE — Assessment & Plan Note (Signed)
Patient given injection today and tolerated the procedure well, discussed icing regimen and home exercises otherwise.  We discussed continuing the recovery sandals, proper shoes.  Worsening pain I do feel that advanced imaging would be warranted.  Patient has responded somewhat to the gabapentin which also makes me concerned that there is a possibility of radiculopathy and EMGs could also be considered if not making improvement.

## 2021-11-28 ENCOUNTER — Other Ambulatory Visit: Payer: Self-pay | Admitting: Family Medicine

## 2021-12-07 ENCOUNTER — Ambulatory Visit: Payer: No Typology Code available for payment source | Admitting: Family Medicine

## 2022-01-02 ENCOUNTER — Ambulatory Visit: Payer: No Typology Code available for payment source | Admitting: Family Medicine

## 2022-04-21 NOTE — Progress Notes (Unsigned)
[place] [employer] Phone: [phone] Subjective:   [name], am serving as a scribe for Dr. Hulan Saas.  I'm seeing this patient by the request  of:  Dian Queen, MD  CC: heel pain and other pain   [id number]  10/26/2021 Patient given injection today and tolerated the procedure well, discussed icing regimen and home exercises otherwise.  We discussed continuing the recovery sandals, proper shoes.  Worsening pain I do feel that advanced imaging would be warranted.  Patient has responded somewhat to the gabapentin which also makes me concerned that there is a possibility of radiculopathy and EMGs could also be considered if not making improvement.      Update 04/24/2022 Cheryl Escobar is a 52 y.o. female coming in with complaint of L heel pain.  Patient was last seen in September for plantar fascia injection at that time.  Patient over the course of time has been getting prescription for also gabapentin as well as prednisone.  Patient states that she is doing a lot better. Had ART done and this improved her pain.  Today she would like to discuss cramping in L gastroc. Gabapentin is helping with cramping. Would like to discuss using this medication long term.       Past Medical History:  Diagnosis Date   Patent foramen ovale    VSD (ventricular septal defect)    small peri-membranous   VSD (ventricular septal defect)    Past Surgical History:  Procedure Laterality Date   CESAREAN SECTION     x 2   Social History   Socioeconomic History   Marital status: Married    Spouse name: Not on file   Number of children: Not on file   Years of education: Not on file   Highest education level: Not on file  Occupational History   Not on file  Tobacco Use   Smoking status: Never   Smokeless tobacco: Never  Substance and Sexual Activity   Alcohol use: Yes   Drug use: No   Sexual activity: Not on file   Other Topics Concern   Not on file  Social History Narrative   Not on file   Social Determinants of Health   Financial Resource Strain: Not on file  Food Insecurity: Not on file  Transportation Needs: Not on file  Physical Activity: Not on file  Stress: Not on file  Social Connections: Not on file   No Known Allergies Family History  Problem Relation Age of Onset   Hyperlipidemia Mother    Breast cancer Maternal Aunt        diagnosed in her 75's         Current Outpatient Medications (Other):    eszopiclone 3 MG TABS, Take 3 mg by mouth at bedtime. Take immediately before bedtime   gabapentin (NEURONTIN) 100 MG capsule, TAKE 2 CAPSULES BY MOUTH AT BEDTIME.   gabapentin (NEURONTIN) 100 MG capsule, Take 2 capsules (200 mg total) by mouth at bedtime.   Reviewed prior external information including notes and imaging from  primary care provider As well as notes that were available from care everywhere and other healthcare systems.  Past medical history, social, surgical and family history all reviewed in electronic medical record.  No pertanent information unless stated regarding to the chief complaint.   Review of Systems:  No headache, visual changes, nausea, vomiting, diarrhea, constipation, dizziness, abdominal pain, skin rash, fevers, chills, night sweats,  weight loss, swollen lymph nodes, body aches, joint swelling, chest pain, shortness of breath, mood changes. POSITIVE muscle aches and cramping but usually at night  Objective  Blood pressure 110/80, pulse 66, height '5\' 6"'$  (1.676 m), weight 129 lb (58.5 kg), last menstrual period 06/22/2021, SpO2 99 %.   General: No apparent distress alert and oriented x3 mood and affect normal, dressed appropriately.  HEENT: Pupils equal, extraocular movements intact  Respiratory: Patient's speak in full sentences and does not appear short of breath  Cardiovascular: No lower extremity edema, non tender, no erythema  Left leg mild  tightness noted.  Nontender over the back itself.  Neurovascularly intact distally with no significant weakness noted.    Impression and Recommendations:     The above documentation has been reviewed and is accurate and complete Lyndal Pulley, DO

## 2022-04-24 ENCOUNTER — Encounter: Payer: Self-pay | Admitting: Family Medicine

## 2022-04-24 ENCOUNTER — Ambulatory Visit (INDEPENDENT_AMBULATORY_CARE_PROVIDER_SITE_OTHER): Payer: No Typology Code available for payment source | Admitting: Family Medicine

## 2022-04-24 ENCOUNTER — Ambulatory Visit: Payer: Self-pay

## 2022-04-24 ENCOUNTER — Other Ambulatory Visit: Payer: Self-pay | Admitting: Obstetrics and Gynecology

## 2022-04-24 ENCOUNTER — Ambulatory Visit (INDEPENDENT_AMBULATORY_CARE_PROVIDER_SITE_OTHER): Payer: No Typology Code available for payment source

## 2022-04-24 VITALS — BP 110/80 | HR 66 | Ht 66.0 in | Wt 129.0 lb

## 2022-04-24 DIAGNOSIS — M79672 Pain in left foot: Secondary | ICD-10-CM

## 2022-04-24 DIAGNOSIS — M549 Dorsalgia, unspecified: Secondary | ICD-10-CM

## 2022-04-24 DIAGNOSIS — G8929 Other chronic pain: Secondary | ICD-10-CM

## 2022-04-24 DIAGNOSIS — R252 Cramp and spasm: Secondary | ICD-10-CM | POA: Diagnosis not present

## 2022-04-24 DIAGNOSIS — Z1231 Encounter for screening mammogram for malignant neoplasm of breast: Secondary | ICD-10-CM

## 2022-04-24 MED ORDER — GABAPENTIN 100 MG PO CAPS: 200.0000 mg | ORAL_CAPSULE | Freq: Every day | ORAL | 3 refills | Status: AC

## 2022-04-24 NOTE — Assessment & Plan Note (Addendum)
Patient is having muscle cramping, seems to be more radicular in nature.  Discussed with patient that I do feel that this is more nerve related especially with patient responding well to the gabapentin.  We discussed changing to different dosing, patient though has elected to continue to keep the dosing the same that we can titrate up to 300 mg if needed.  Patient will consider this.  Discussed posture and ergonomics otherwise, increase activity slowly.  Follow-up again.  1 year as long as patient is doing well.  Lumbar x-ray ordered today to further evaluate for any lumbar radiculopathy in the differential

## 2022-04-24 NOTE — Patient Instructions (Addendum)
Xray today Gabapentin '200mg'$  refilled See me when you need me

## 2022-05-02 ENCOUNTER — Ambulatory Visit
Admission: RE | Admit: 2022-05-02 | Discharge: 2022-05-02 | Disposition: A | Discharge status: Home / Self Care | Payer: No Typology Code available for payment source | Source: Ambulatory Visit | Attending: Obstetrics and Gynecology | Admitting: Obstetrics and Gynecology

## 2022-05-02 DIAGNOSIS — Z1231 Encounter for screening mammogram for malignant neoplasm of breast: Secondary | ICD-10-CM

## 2022-09-29 IMAGING — MG MM DIGITAL SCREENING BILAT W/ TOMO AND CAD
8 series · 9 of 24 positions shown · non-contrast
Comparison: Previous exam(s).

CLINICAL DATA: Screening.

EXAM:
DIGITAL SCREENING BILATERAL MAMMOGRAM WITH TOMOSYNTHESIS AND CAD
TECHNIQUE: Bilateral screening digital craniocaudal and mediolateral oblique
mammograms were obtained. Bilateral screening digital breast
tomosynthesis was performed. The images were evaluated with
computer-aided detection.

[R CC synth-2D]
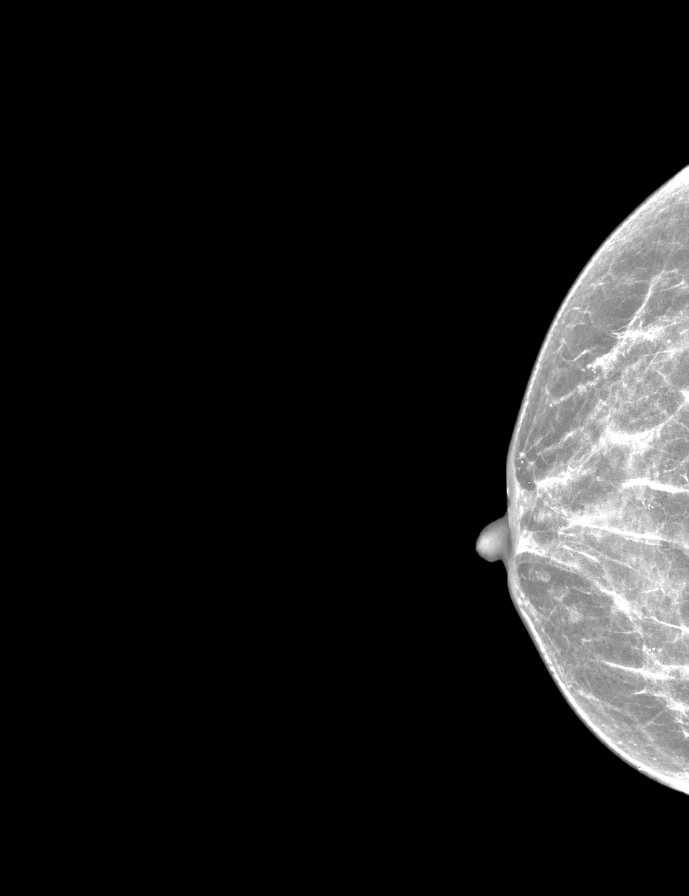

[L CC synth-2D]
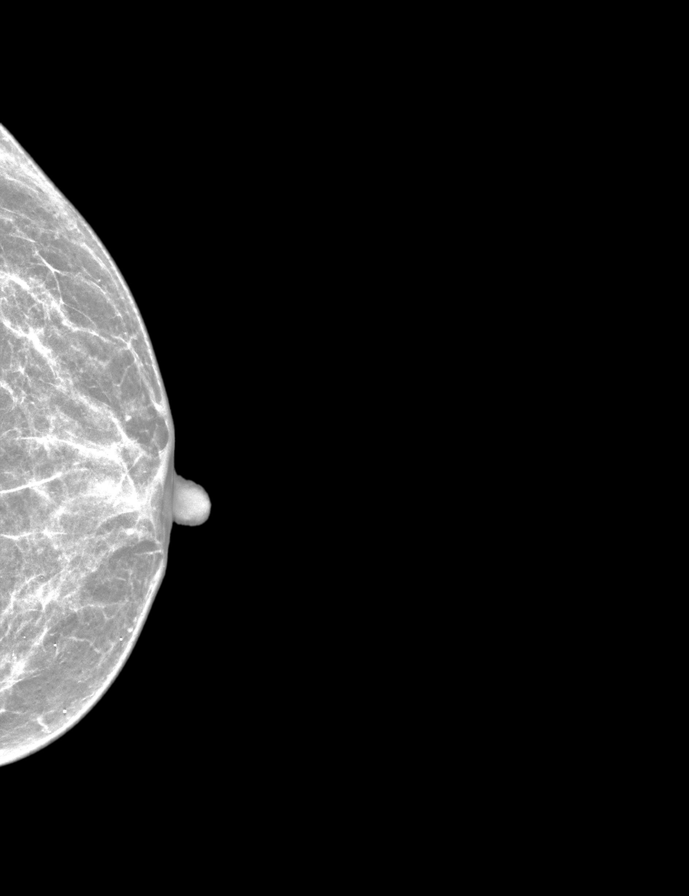

[R MLO synth-2D]
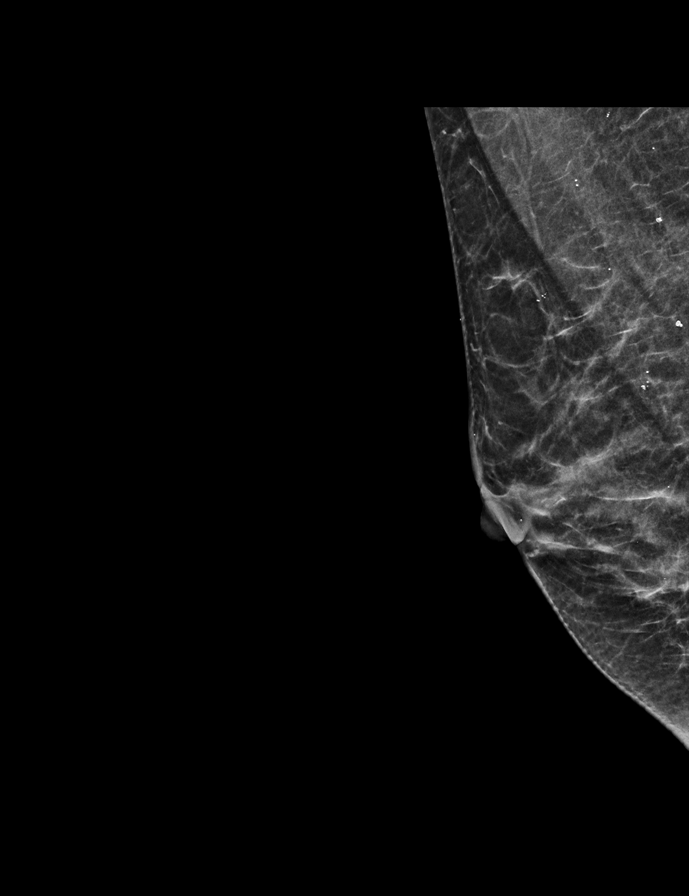

[L MLO synth-2D]
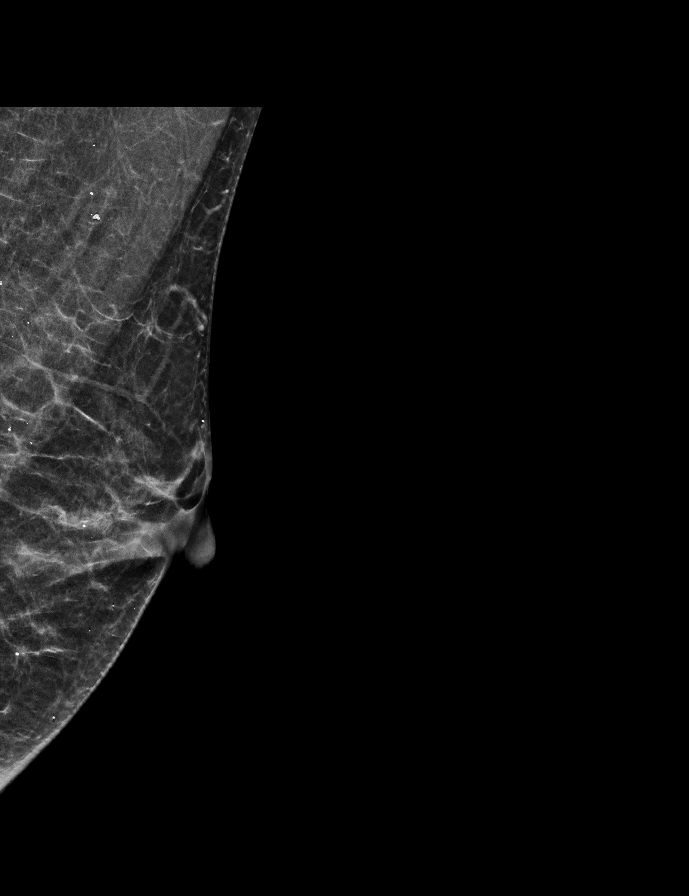

[R MLO tomo · 2 of 46 frames shown]
[frame 15/46]
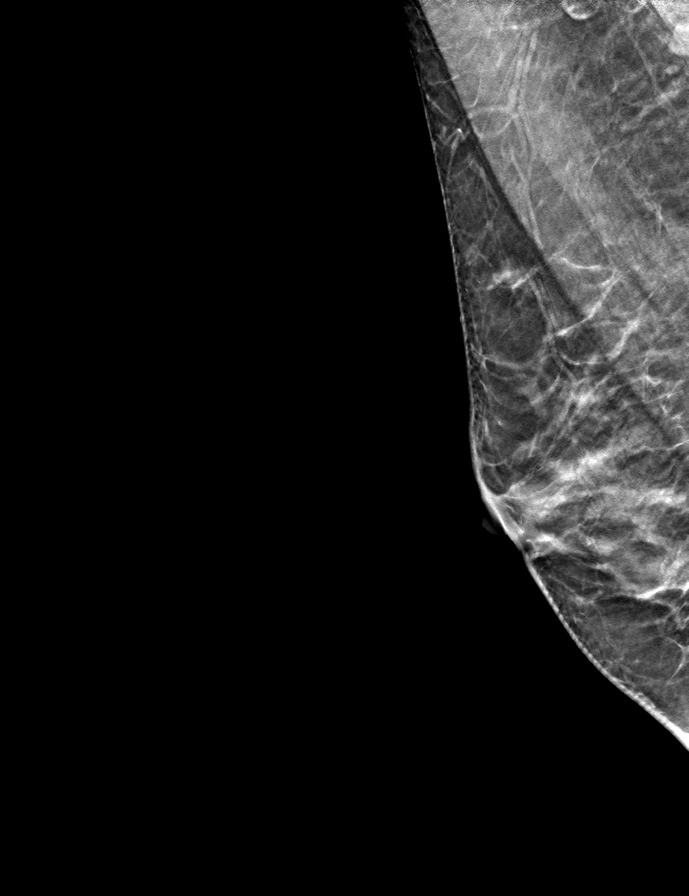
[frame 23/46]
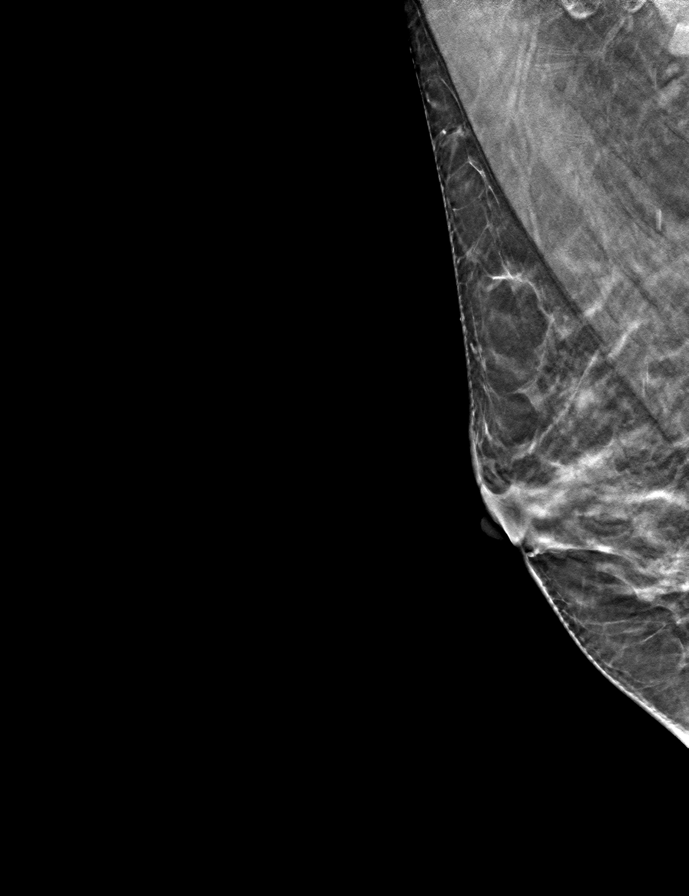

[L MLO tomo · tomo slice 23/45.0]
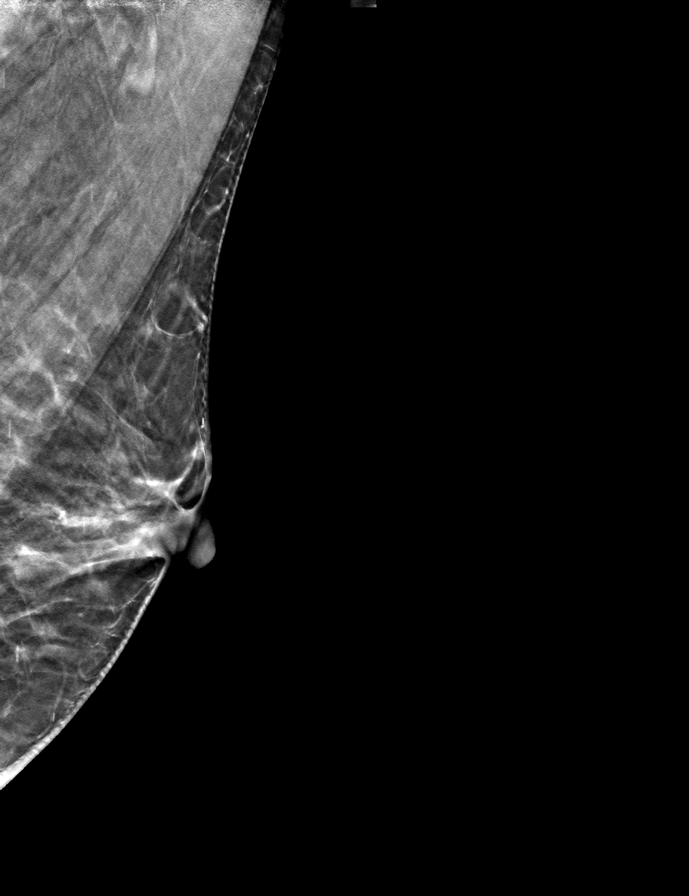

[L CC tomo · tomo slice 23/44.0]
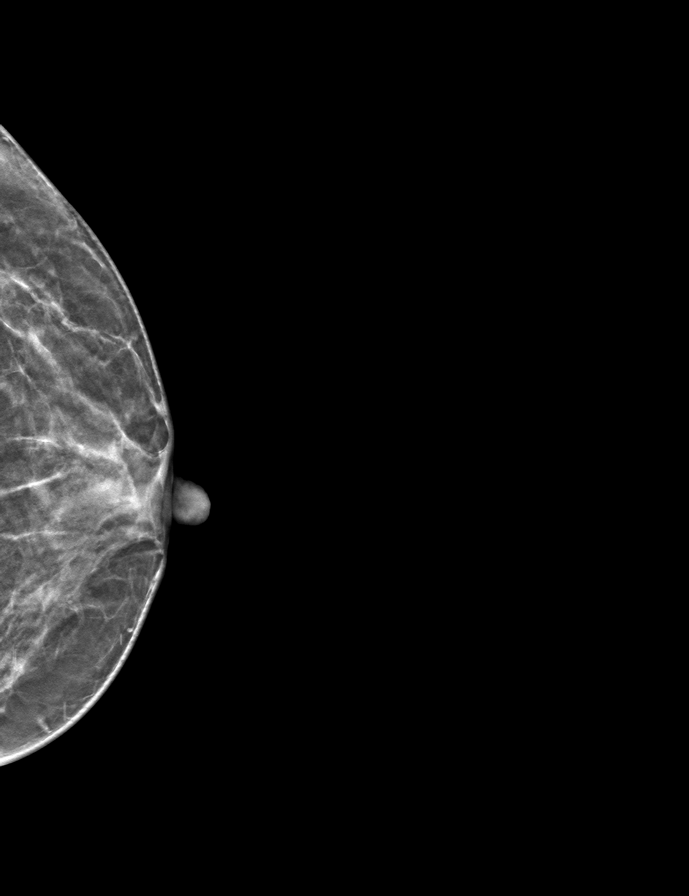

[R CC tomo · tomo slice 25/49.0]
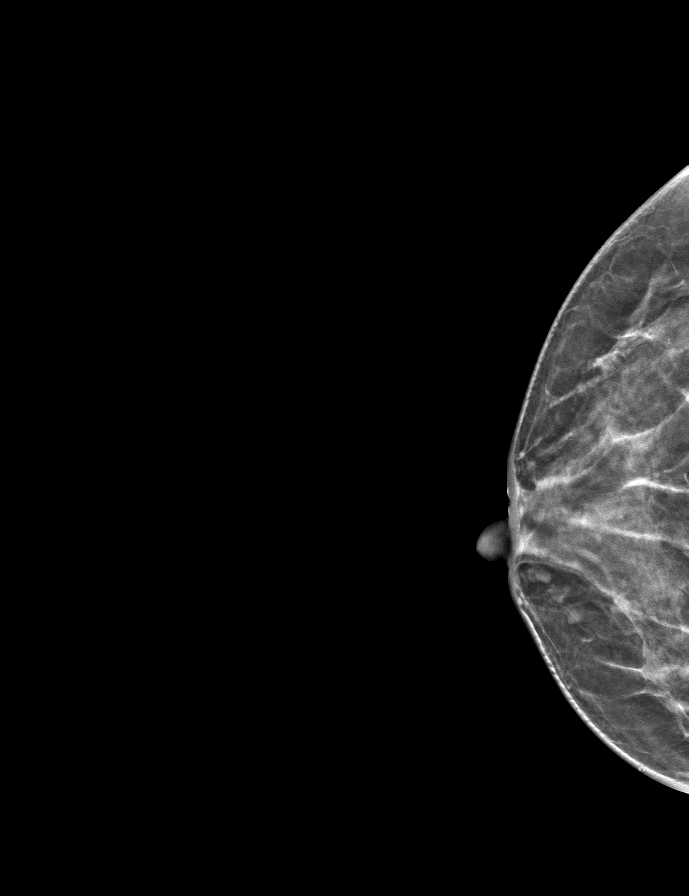

[9 of 24 positions shown; findings below may reference images not displayed]

ACR Breast Density Category c: The breast tissue is heterogeneously
dense, which may obscure small masses.
FINDINGS: There are no findings suspicious for malignancy.
IMPRESSION: No mammographic evidence of malignancy. A result letter of this
screening mammogram will be mailed directly to the patient.

RECOMMENDATION:
Screening mammogram in one year. (Code:Q3-W-BC3)

BI-RADS CATEGORY  1: Negative.

## 2023-05-03 ENCOUNTER — Other Ambulatory Visit: Payer: Self-pay | Admitting: Obstetrics and Gynecology

## 2023-05-03 DIAGNOSIS — Z1231 Encounter for screening mammogram for malignant neoplasm of breast: Secondary | ICD-10-CM

## 2023-05-28 ENCOUNTER — Ambulatory Visit

## 2023-05-29 ENCOUNTER — Ambulatory Visit
Admission: RE | Admit: 2023-05-29 | Discharge: 2023-05-29 | Disposition: A | Discharge status: Home / Self Care | Source: Ambulatory Visit | Attending: Obstetrics and Gynecology | Admitting: Obstetrics and Gynecology

## 2023-05-29 DIAGNOSIS — Z1231 Encounter for screening mammogram for malignant neoplasm of breast: Secondary | ICD-10-CM
